# Patient Record
Sex: Female | Born: 1980 | Hispanic: Yes | Marital: Married | Smoking: Never smoker
Health system: Southern US, Community
[De-identification: ages and names within clinical notes are randomized; demographics above are authoritative.]

## PROBLEM LIST (undated history)

## (undated) DIAGNOSIS — Z789 Other specified health status: Secondary | ICD-10-CM

## (undated) DIAGNOSIS — K219 Gastro-esophageal reflux disease without esophagitis: Secondary | ICD-10-CM

## (undated) HISTORY — DX: Gastro-esophageal reflux disease without esophagitis: K21.9

## (undated) HISTORY — PX: NO PAST SURGERIES: SHX2092

---

## 2008-08-17 ENCOUNTER — Ambulatory Visit: Payer: Self-pay | Admitting: Certified Nurse Midwife

## 2009-01-20 ENCOUNTER — Observation Stay: Payer: Self-pay

## 2009-01-21 ENCOUNTER — Inpatient Hospital Stay: Payer: Self-pay

## 2010-09-03 ENCOUNTER — Ambulatory Visit: Payer: Self-pay | Admitting: Family

## 2015-08-19 NOTE — L&D Delivery Note (Signed)
Delivery Note At 7:52 PM a viable and female  was delivered via Vaginal, Spontaneous Delivery (Presentation:LOA ;  ).Delayed cord clamping   APGAR: 8, 9; weight  .   Placenta status:intact  , . 3v Cord:  with NO  complications: .    Anesthesia:   Episiotomy: None Lacerations: None Suture Repair: n/a Est. Blood Loss (mL):  200 cc  Mom to postpartum.  Baby to Couplet care / Skin to Skin.  Yvonne Sweeney 04/02/2016, 8:08 PM

## 2015-09-26 LAB — OB RESULTS CONSOLE HEPATITIS B SURFACE ANTIGEN: Hepatitis B Surface Ag: NEGATIVE

## 2015-09-26 LAB — OB RESULTS CONSOLE RPR: RPR: NONREACTIVE

## 2015-09-26 LAB — OB RESULTS CONSOLE HIV ANTIBODY (ROUTINE TESTING): HIV: NONREACTIVE

## 2015-09-26 LAB — OB RESULTS CONSOLE RUBELLA ANTIBODY, IGM: RUBELLA: IMMUNE

## 2015-09-26 LAB — OB RESULTS CONSOLE GC/CHLAMYDIA: GC PROBE AMP, GENITAL: NEGATIVE

## 2015-10-30 ENCOUNTER — Other Ambulatory Visit: Payer: Self-pay | Admitting: Primary Care

## 2015-10-30 DIAGNOSIS — Z3492 Encounter for supervision of normal pregnancy, unspecified, second trimester: Secondary | ICD-10-CM

## 2015-11-19 ENCOUNTER — Ambulatory Visit
Admission: RE | Admit: 2015-11-19 | Discharge: 2015-11-19 | Disposition: A | Discharge status: Home / Self Care | Payer: Medicaid Other | Source: Ambulatory Visit | Attending: Obstetrics & Gynecology | Admitting: Obstetrics & Gynecology

## 2015-11-19 DIAGNOSIS — Z3A19 19 weeks gestation of pregnancy: Secondary | ICD-10-CM | POA: Diagnosis not present

## 2015-11-19 DIAGNOSIS — Z3402 Encounter for supervision of normal first pregnancy, second trimester: Secondary | ICD-10-CM | POA: Insufficient documentation

## 2015-11-19 DIAGNOSIS — Z36 Encounter for antenatal screening of mother: Secondary | ICD-10-CM | POA: Diagnosis present

## 2015-11-19 DIAGNOSIS — Z3492 Encounter for supervision of normal pregnancy, unspecified, second trimester: Secondary | ICD-10-CM

## 2015-11-19 HISTORY — DX: Other specified health status: Z78.9

## 2015-12-03 ENCOUNTER — Ambulatory Visit
Admission: RE | Admit: 2015-12-03 | Discharge: 2015-12-03 | Disposition: A | Discharge status: Home / Self Care | Payer: Medicaid Other | Source: Ambulatory Visit | Attending: Maternal & Fetal Medicine | Admitting: Maternal & Fetal Medicine

## 2015-12-03 DIAGNOSIS — O26892 Other specified pregnancy related conditions, second trimester: Secondary | ICD-10-CM | POA: Insufficient documentation

## 2015-12-03 DIAGNOSIS — Z331 Pregnant state, incidental: Secondary | ICD-10-CM | POA: Diagnosis not present

## 2015-12-03 DIAGNOSIS — Z3A21 21 weeks gestation of pregnancy: Secondary | ICD-10-CM | POA: Insufficient documentation

## 2015-12-03 DIAGNOSIS — Z3492 Encounter for supervision of normal pregnancy, unspecified, second trimester: Secondary | ICD-10-CM

## 2016-02-22 LAB — OB RESULTS CONSOLE GC/CHLAMYDIA: CHLAMYDIA, DNA PROBE: NEGATIVE

## 2016-03-23 LAB — OB RESULTS CONSOLE GBS: STREP GROUP B AG: NEGATIVE

## 2016-04-02 ENCOUNTER — Encounter: Payer: Self-pay | Admitting: *Deleted

## 2016-04-02 ENCOUNTER — Inpatient Hospital Stay
Admission: RE | Admit: 2016-04-02 | Discharge: 2016-04-04 | DRG: 775 | Disposition: A | Discharge status: Home / Self Care | Payer: Medicaid Other | Attending: Obstetrics and Gynecology | Admitting: Obstetrics and Gynecology

## 2016-04-02 DIAGNOSIS — O479 False labor, unspecified: Secondary | ICD-10-CM

## 2016-04-02 DIAGNOSIS — Z3A38 38 weeks gestation of pregnancy: Secondary | ICD-10-CM

## 2016-04-02 DIAGNOSIS — Z3490 Encounter for supervision of normal pregnancy, unspecified, unspecified trimester: Secondary | ICD-10-CM

## 2016-04-02 LAB — RAPID HIV SCREEN (HIV 1/2 AB+AG)
HIV 1/2 Antibodies: NONREACTIVE
HIV-1 P24 ANTIGEN - HIV24: NONREACTIVE

## 2016-04-02 LAB — CBC
HCT: 36.9 % (ref 35.0–47.0)
Hemoglobin: 12.9 g/dL (ref 12.0–16.0)
MCH: 32 pg (ref 26.0–34.0)
MCHC: 35 g/dL (ref 32.0–36.0)
MCV: 91.3 fL (ref 80.0–100.0)
PLATELETS: 221 10*3/uL (ref 150–440)
RBC: 4.04 MIL/uL (ref 3.80–5.20)
RDW: 13.3 % (ref 11.5–14.5)
WBC: 10.1 10*3/uL (ref 3.6–11.0)

## 2016-04-02 LAB — TYPE AND SCREEN
ABO/RH(D): A POS
Antibody Screen: NEGATIVE

## 2016-04-02 MED ORDER — FERROUS SULFATE 325 (65 FE) MG PO TABS
325.0000 mg | ORAL_TABLET | Freq: Two times a day (BID) | ORAL | Status: DC
  Administered 2016-04-03 – 2016-04-04 (×3): 325 mg via ORAL
  Filled 2016-04-02 (×3): qty 1

## 2016-04-02 MED ORDER — SIMETHICONE 80 MG PO CHEW: 80.0000 mg | CHEWABLE_TABLET | ORAL | Status: DC | PRN

## 2016-04-02 MED ORDER — ACETAMINOPHEN 325 MG PO TABS: 650.0000 mg | ORAL_TABLET | ORAL | Status: DC | PRN

## 2016-04-02 MED ORDER — OXYTOCIN 40 UNITS IN LACTATED RINGERS INFUSION - SIMPLE MED
2.5000 [IU]/h | INTRAVENOUS | Status: DC
  Filled 2016-04-02: qty 1000

## 2016-04-02 MED ORDER — COCONUT OIL OIL
1.0000 "application " | TOPICAL_OIL | Status: DC | PRN
  Administered 2016-04-03: 1 via TOPICAL
  Filled 2016-04-02: qty 120

## 2016-04-02 MED ORDER — TERBUTALINE SULFATE 1 MG/ML IJ SOLN: 0.2500 mg | Freq: Once | INTRAMUSCULAR | Status: DC | PRN

## 2016-04-02 MED ORDER — OXYCODONE-ACETAMINOPHEN 5-325 MG PO TABS: 2.0000 | ORAL_TABLET | ORAL | Status: DC | PRN

## 2016-04-02 MED ORDER — MEASLES, MUMPS & RUBELLA VAC ~~LOC~~ INJ: 0.5000 mL | INJECTION | Freq: Once | SUBCUTANEOUS | Status: DC

## 2016-04-02 MED ORDER — ONDANSETRON HCL 4 MG/2ML IJ SOLN: 4.0000 mg | INTRAMUSCULAR | Status: DC | PRN

## 2016-04-02 MED ORDER — ZOLPIDEM TARTRATE 5 MG PO TABS: 5.0000 mg | ORAL_TABLET | Freq: Every evening | ORAL | Status: DC | PRN

## 2016-04-02 MED ORDER — MISOPROSTOL 200 MCG PO TABS
ORAL_TABLET | ORAL | Status: AC
  Filled 2016-04-02: qty 4

## 2016-04-02 MED ORDER — AMMONIA AROMATIC IN INHA
RESPIRATORY_TRACT | Status: AC
  Filled 2016-04-02: qty 10

## 2016-04-02 MED ORDER — LIDOCAINE HCL (PF) 1 % IJ SOLN: 30.0000 mL | INTRAMUSCULAR | Status: DC | PRN

## 2016-04-02 MED ORDER — OXYCODONE-ACETAMINOPHEN 5-325 MG PO TABS: 1.0000 | ORAL_TABLET | ORAL | Status: DC | PRN

## 2016-04-02 MED ORDER — SENNOSIDES-DOCUSATE SODIUM 8.6-50 MG PO TABS
2.0000 | ORAL_TABLET | ORAL | Status: DC
  Administered 2016-04-03 – 2016-04-04 (×2): 2 via ORAL
  Filled 2016-04-02 (×2): qty 2

## 2016-04-02 MED ORDER — DIBUCAINE 1 % RE OINT: 1.0000 "application " | TOPICAL_OINTMENT | RECTAL | Status: DC | PRN

## 2016-04-02 MED ORDER — OXYTOCIN 10 UNIT/ML IJ SOLN
INTRAMUSCULAR | Status: AC
  Filled 2016-04-02: qty 2

## 2016-04-02 MED ORDER — BUTORPHANOL TARTRATE 1 MG/ML IJ SOLN
INTRAMUSCULAR | Status: AC
  Administered 2016-04-02: 1 mg via INTRAVENOUS
  Filled 2016-04-02: qty 1

## 2016-04-02 MED ORDER — OXYTOCIN BOLUS FROM INFUSION
500.0000 mL | Freq: Once | INTRAVENOUS | Status: AC
  Administered 2016-04-02: 500 mL via INTRAVENOUS

## 2016-04-02 MED ORDER — IBUPROFEN 600 MG PO TABS
600.0000 mg | ORAL_TABLET | Freq: Four times a day (QID) | ORAL | Status: DC
  Administered 2016-04-03 – 2016-04-04 (×5): 600 mg via ORAL
  Filled 2016-04-02 (×5): qty 1

## 2016-04-02 MED ORDER — OXYTOCIN 40 UNITS IN LACTATED RINGERS INFUSION - SIMPLE MED
2.0000 m[IU]/min | INTRAVENOUS | Status: DC
  Administered 2016-04-02: 2 m[IU]/min via INTRAVENOUS

## 2016-04-02 MED ORDER — SOD CITRATE-CITRIC ACID 500-334 MG/5ML PO SOLN: 30.0000 mL | ORAL | Status: DC | PRN

## 2016-04-02 MED ORDER — LACTATED RINGERS IV SOLN
500.0000 mL | INTRAVENOUS | Status: DC | PRN
Start: 2016-04-02 — End: 2016-04-03

## 2016-04-02 MED ORDER — ONDANSETRON HCL 4 MG/2ML IJ SOLN: 4.0000 mg | Freq: Four times a day (QID) | INTRAMUSCULAR | Status: DC | PRN

## 2016-04-02 MED ORDER — MAGNESIUM HYDROXIDE 400 MG/5ML PO SUSP: 30.0000 mL | ORAL | Status: DC | PRN

## 2016-04-02 MED ORDER — BUTORPHANOL TARTRATE 1 MG/ML IJ SOLN
1.0000 mg | INTRAMUSCULAR | Status: DC | PRN
  Administered 2016-04-02: 1 mg via INTRAVENOUS

## 2016-04-02 MED ORDER — ONDANSETRON HCL 4 MG PO TABS: 4.0000 mg | ORAL_TABLET | ORAL | Status: DC | PRN

## 2016-04-02 MED ORDER — LACTATED RINGERS IV SOLN
INTRAVENOUS | Status: DC
  Administered 2016-04-02 (×2): 125 mL/h via INTRAVENOUS

## 2016-04-02 MED ORDER — DIPHENHYDRAMINE HCL 25 MG PO CAPS: 25.0000 mg | ORAL_CAPSULE | Freq: Four times a day (QID) | ORAL | Status: DC | PRN

## 2016-04-02 MED ORDER — WITCH HAZEL-GLYCERIN EX PADS: 1.0000 "application " | MEDICATED_PAD | CUTANEOUS | Status: DC | PRN

## 2016-04-02 MED ORDER — LIDOCAINE HCL (PF) 1 % IJ SOLN
INTRAMUSCULAR | Status: AC
  Filled 2016-04-02: qty 30

## 2016-04-02 MED ORDER — BENZOCAINE-MENTHOL 20-0.5 % EX AERO: 1.0000 "application " | INHALATION_SPRAY | CUTANEOUS | Status: DC | PRN

## 2016-04-02 MED ORDER — PRENATAL MULTIVITAMIN CH
1.0000 | ORAL_TABLET | Freq: Every day | ORAL | Status: DC
  Administered 2016-04-03: 1 via ORAL
  Filled 2016-04-02: qty 1

## 2016-04-02 NOTE — H&P (Signed)
Yvonne Sweeney is a 35 y.o. female presenting for 38+4 weeks .by lmp and conf u/s . Admitted for ROM nitrazine + . No significant prenatal issues . Neg GBS OB History    Gravida Para Term Preterm AB Living   4 2 2     3    SAB TAB Ectopic Multiple Live Births           3     Past Medical History:  Diagnosis Date  . Medical history non-contributory    Past Surgical History:  Procedure Laterality Date  . NO PAST SURGERIES     Family History: family history is not on file. Social History:  reports that she has never smoked. She has never used smokeless tobacco. She reports that she does not drink alcohol or use drugs.     Maternal Diabetes: No Genetic Screening: Declined Maternal Ultrasounds/Referrals: Normal Fetal Ultrasounds or other Referrals:  None Maternal Substance Abuse:  No Significant Maternal Medications:  None Significant Maternal Lab Results:  None Other Comments:  None  ROS History Dilation: 2 Exam by:: A. Millner Last menstrual period 07/08/2015. Exam Physical Exam   Lungs CTA  CV RRR  adb gravid  EFM: 140 + ACCELS , NO DECELS - REASSURING , infrequent CTX  Prenatal labs: ABO, Rh:  A+ Antibody:  neg Rubella:  imm RPR:   NR HBsAg:  neg  HIV:   neg GBS:   Neg GBS   Assessment/Plan: 38+4 week  SROM  Will place FSE and IUPC and start Pitocin   SCHERMERHORN,THOMAS 04/02/2016, 10:20 AM

## 2016-04-02 NOTE — Progress Notes (Signed)
Pt. Arrived with spouse...with c/o of SROM at 0600 this a.m.  Clear fluid, mod. Amount.  Nitrazine positive, pt. Admit to LDR #2. Provider notified. Pt. Not feeling contractions, abd soft.  Toco and U/S applied, FHR - 140s.

## 2016-04-02 NOTE — Progress Notes (Signed)
Patient ID: Yvonne Sweeney, female   DOB: October 17, 1980, 35 y.o.   MRN: [id number] Pit at 12 mu/ min  Ctx q 2-3 min MVU 240  vss   cx by RN : 4 cm /OOP reassurring fetal monitoring  Cont Pitocin :

## 2016-04-02 NOTE — Progress Notes (Signed)
Afanador, Research officer, trade unionpanish Interpreter,  at the bedside for admission consent and plan of care...discussed with patient. Pt. Desires to leave options open in case epidural is needed; pt. Desires to start with pain medication in IV. Delivered last three babies with pain medication via IV.

## 2016-04-03 ENCOUNTER — Encounter: Payer: Self-pay | Admitting: *Deleted

## 2016-04-03 DIAGNOSIS — Z3490 Encounter for supervision of normal pregnancy, unspecified, unspecified trimester: Secondary | ICD-10-CM

## 2016-04-03 LAB — CBC
HCT: 35.9 % (ref 35.0–47.0)
HEMOGLOBIN: 12.4 g/dL (ref 12.0–16.0)
MCH: 31.7 pg (ref 26.0–34.0)
MCHC: 34.5 g/dL (ref 32.0–36.0)
MCV: 92.1 fL (ref 80.0–100.0)
PLATELETS: 195 10*3/uL (ref 150–440)
RBC: 3.9 MIL/uL (ref 3.80–5.20)
RDW: 13.1 % (ref 11.5–14.5)
WBC: 16.8 10*3/uL — AB (ref 3.6–11.0)

## 2016-04-03 LAB — RPR: RPR Ser Ql: NONREACTIVE

## 2016-04-03 NOTE — Progress Notes (Signed)
RN edited the Deliver Summary to change the infant's birth weight; the correct infant birth weight was 3280g (7lbs 4oz); the baby's birth weight was entered at 3932801g (72lbs); all that was edited on the Delivery Summary was the infant birth weight

## 2016-04-03 NOTE — Progress Notes (Signed)
Post Partum Day 1 Subjective: no complaints, up ad lib, voiding and tolerating PO  Objective: Blood pressure (!) 98/54, pulse 70, temperature 98.5 F (36.9 C), temperature source Oral, resp. rate 18, height 5\' 5"  (1.651 m), weight 193 lb (87.5 kg), last menstrual period 07/08/2015, SpO2 99 %, unknown if currently breastfeeding.  Physical Exam:  General: alert, cooperative and appears stated age Lochia: appropriate Uterine Fundus: firm DVT Evaluation: No evidence of DVT seen on physical exam.   Recent Labs  04/02/16 1032 04/03/16 0415  HGB 12.9 12.4  HCT 36.9 35.9    Assessment/Plan: Plan for discharge tomorrow and Breastfeeding   LOS: 1 day   Yvonne Sweeney 04/03/2016, 9:02 AM

## 2016-04-04 MED ORDER — IBUPROFEN 600 MG PO TABS: 600.0000 mg | ORAL_TABLET | Freq: Four times a day (QID) | ORAL | 0 refills | Status: DC

## 2016-04-04 NOTE — Discharge Summary (Signed)
Obstetric Discharge Summary Reason for Admission: onset of labor Prenatal Procedures: none Intrapartum Procedures: spontaneous vaginal delivery Postpartum Procedures: none Complications-Operative and Postpartum: none Hemoglobin  Date Value Ref Range Status  04/03/2016 12.4 12.0 - 16.0 g/dL Final   HCT  Date Value Ref Range Status  04/03/2016 35.9 35.0 - 47.0 % Final  Brief Hospital Course  Roe Rutherforddriana Coulibaly is a G4P4who had a SVD on 04/02/16;  for further details of this delivery, please refer to the delivery note.  Patient had an uncomplicated postpartum course.  By time of discharge on PPD#2, her pain was controlled on oral pain medications; she had appropriate lochia and was ambulating, voiding without difficulty and tolerating regular diet.  She was deemed stable for discharge to home.    Physical Exam:  General: alert and cooperative Lochia: appropriate Uterine Fundus: firm Incision: none DVT Evaluation: No evidence of DVT seen on physical exam.  Discharge Diagnoses: Term Pregnancy-delivered  Discharge Information: Date: 04/04/2016 Activity: pelvic rest Diet: routine Medications: Ibuprofen Condition: stable Instructions:  Discharge Instructions    Activity as tolerated    Complete by:  As directed   Call MD for:    Complete by:  As directed   Signs and symptoms of postpartum depression, unable to care for yourself or your baby.  If you develop suicidal or homicidal thoughts, go to the emergency room immediately.   Call MD for:  difficulty breathing, headache or visual disturbances    Complete by:  As directed   Call MD for:  extreme fatigue    Complete by:  As directed   Call MD for:  hives    Complete by:  As directed   Call MD for:  persistant dizziness or light-headedness    Complete by:  As directed   Call MD for:  persistant nausea and vomiting    Complete by:  As directed   Call MD for:  redness, tenderness, or signs of infection (pain, swelling, redness, odor or  green/yellow discharge around incision site)    Complete by:  As directed   Call MD for:  severe uncontrolled pain    Complete by:  As directed   Call MD for:  temperature >100.4    Complete by:  As directed   Diet - low sodium heart healthy    Complete by:  As directed   Sexual acrtivity    Complete by:  As directed   No intercourse for 6 weeks     Discharge to: home Follow-up Information    [facility]   Specialty:  General Practice Why:  Postpartum visit Contact information: [address] [place] [address] [phone]         Contraception: condoms   Newborn Data: Live born female  Birth Weight: 7 lb 3.7 oz (3280 g) APGAR: 8, 9 Breast and Formula feeding Home with mother.  Karena AddisonSigmon, Vergil Burby C 04/04/2016, 8:31 AM

## 2016-04-04 NOTE — Progress Notes (Signed)
Pt discharged home with infant.  Discharge instructions and follow up appointment given to and reviewed with pt.  Pt verbalized understanding.  Escorted by auxillary. 

## 2016-04-04 NOTE — Discharge Instructions (Signed)

## 2017-08-18 NOTE — L&D Delivery Note (Signed)
Delivery Note At 2:28 AM a viable and healthy girl "Yvonne Sweeney" was delivered via Vaginal, Spontaneous (Presentation: ROA).  APGAR: 8,9. weight 7#10oz .   Placenta status: spontaneous, intact  Cord: 3vessel cord with the following complications: none.  Cord pH: 7.19, Co2 67.  Anesthesia:  epidural Episiotomy:  none Lacerations:  none Suture Repair: n/a Est. Blood Loss (mL):    Mom to postpartum.  Baby to Couplet care / Skin to Skin.  I was present and scrubbed for the entirety of the delivery  37yo G5P4004 at 40+0wks presenting with SROM at 0300 yesterday, progressed with pitocin to fully dilated. However, fetal heart rate tracing Cat II with tachycardia throughout the day, and at the end, deep repetitive variable decels. The CNM Haviland called me to bedside, where I found an asynclitic LOP baby at -1 station, significant labial edema and a comfortable epidural. We discussed possible need for cesarean, and after consenting patient, I destationed the baby and rotated her to ROA, and the patient pushed well over an intact perineum. She delivered easily in several pushes and baby was promptly vigorous and placed on maternal abdomen. The placenta delivered spontaneously and was intact. Her fundus was firm and postpartum pit running. Cord gases were collected.  Yvonne Sweeney 03/13/2018, 2:34 AM

## 2017-10-01 ENCOUNTER — Other Ambulatory Visit: Payer: Self-pay | Admitting: Primary Care

## 2017-10-01 DIAGNOSIS — Z3482 Encounter for supervision of other normal pregnancy, second trimester: Secondary | ICD-10-CM

## 2017-10-01 LAB — OB RESULTS CONSOLE GC/CHLAMYDIA
Chlamydia: NEGATIVE
Gonorrhea: NEGATIVE

## 2017-10-01 LAB — OB RESULTS CONSOLE VARICELLA ZOSTER ANTIBODY, IGG: VARICELLA IGG: IMMUNE

## 2017-10-01 LAB — OB RESULTS CONSOLE RPR: RPR: NONREACTIVE

## 2017-10-01 LAB — OB RESULTS CONSOLE HEPATITIS B SURFACE ANTIGEN: HEP B S AG: NEGATIVE

## 2017-10-01 LAB — OB RESULTS CONSOLE RUBELLA ANTIBODY, IGM: RUBELLA: IMMUNE

## 2017-10-01 LAB — OB RESULTS CONSOLE GBS: GBS: NEGATIVE

## 2017-10-22 ENCOUNTER — Ambulatory Visit
Admission: RE | Admit: 2017-10-22 | Discharge: 2017-10-22 | Disposition: A | Discharge status: Home / Self Care | Payer: Self-pay | Source: Ambulatory Visit | Attending: Primary Care | Admitting: Primary Care

## 2017-10-22 DIAGNOSIS — Z3A19 19 weeks gestation of pregnancy: Secondary | ICD-10-CM | POA: Insufficient documentation

## 2017-10-22 DIAGNOSIS — Z3482 Encounter for supervision of other normal pregnancy, second trimester: Secondary | ICD-10-CM | POA: Insufficient documentation

## 2018-03-09 ENCOUNTER — Other Ambulatory Visit: Payer: Self-pay | Admitting: Obstetrics and Gynecology

## 2018-03-09 NOTE — Progress Notes (Signed)
Prenatal Labs: Blood type/Rh  A Pos  Antibody screen neg  Rubella Immune  Varicella Immune  RPR NR  HBsAg Neg  HIV NR  GC neg  Chlamydia neg  Genetic screening  declined  1 hour GTT  90  GBS  neg 02/21/18   [facility] by L[facility], C/w US at 19+2wks AMA, age 37, Tdap given 01/08/18 Cholestasis dx 7/22

## 2018-03-12 ENCOUNTER — Inpatient Hospital Stay: Payer: Medicaid Other | Admitting: Anesthesiology

## 2018-03-12 ENCOUNTER — Other Ambulatory Visit: Payer: Self-pay

## 2018-03-12 ENCOUNTER — Inpatient Hospital Stay
Admission: RE | Admit: 2018-03-12 | Discharge: 2018-03-14 | DRG: 806 | Disposition: A | Discharge status: Home / Self Care | Payer: Medicaid Other | Attending: Certified Nurse Midwife | Admitting: Certified Nurse Midwife

## 2018-03-12 DIAGNOSIS — Z3A39 39 weeks gestation of pregnancy: Secondary | ICD-10-CM

## 2018-03-12 DIAGNOSIS — O4292 Full-term premature rupture of membranes, unspecified as to length of time between rupture and onset of labor: Principal | ICD-10-CM | POA: Diagnosis present

## 2018-03-12 DIAGNOSIS — O4290 Premature rupture of membranes, unspecified as to length of time between rupture and onset of labor, unspecified weeks of gestation: Secondary | ICD-10-CM | POA: Diagnosis present

## 2018-03-12 LAB — COMPREHENSIVE METABOLIC PANEL
ALT: 36 U/L (ref 0–44)
AST: 38 U/L (ref 15–41)
Albumin: 2.7 g/dL — ABNORMAL LOW (ref 3.5–5.0)
Alkaline Phosphatase: 194 U/L — ABNORMAL HIGH (ref 38–126)
Anion gap: 6 (ref 5–15)
BUN: 14 mg/dL (ref 6–20)
CO2: 23 mmol/L (ref 22–32)
Calcium: 8.7 mg/dL — ABNORMAL LOW (ref 8.9–10.3)
Chloride: 108 mmol/L (ref 98–111)
Creatinine, Ser: 0.56 mg/dL (ref 0.44–1.00)
Glucose, Bld: 88 mg/dL (ref 70–99)
POTASSIUM: 4 mmol/L (ref 3.5–5.1)
Sodium: 137 mmol/L (ref 135–145)
Total Bilirubin: 0.9 mg/dL (ref 0.3–1.2)
Total Protein: 6.6 g/dL (ref 6.5–8.1)

## 2018-03-12 LAB — CBC
HCT: 37.8 % (ref 35.0–47.0)
Hemoglobin: 13.1 g/dL (ref 12.0–16.0)
MCH: 31.7 pg (ref 26.0–34.0)
MCHC: 34.6 g/dL (ref 32.0–36.0)
MCV: 91.6 fL (ref 80.0–100.0)
PLATELETS: 207 10*3/uL (ref 150–440)
RBC: 4.13 MIL/uL (ref 3.80–5.20)
RDW: 13.3 % (ref 11.5–14.5)
WBC: 9.2 10*3/uL (ref 3.6–11.0)

## 2018-03-12 LAB — TYPE AND SCREEN
ABO/RH(D): A POS
Antibody Screen: NEGATIVE

## 2018-03-12 LAB — RAPID HIV SCREEN (HIV 1/2 AB+AG)
HIV 1/2 Antibodies: NONREACTIVE
HIV-1 P24 ANTIGEN - HIV24: NONREACTIVE

## 2018-03-12 MED ORDER — OXYTOCIN 10 UNIT/ML IJ SOLN
INTRAMUSCULAR | Status: AC
  Filled 2018-03-12: qty 2

## 2018-03-12 MED ORDER — LIDOCAINE HCL (PF) 1 % IJ SOLN
INTRAMUSCULAR | Status: AC
  Filled 2018-03-12: qty 30

## 2018-03-12 MED ORDER — OXYTOCIN 40 UNITS IN LACTATED RINGERS INFUSION - SIMPLE MED
1.0000 m[IU]/min | INTRAVENOUS | Status: DC
  Administered 2018-03-12: 1 m[IU]/min via INTRAVENOUS
  Filled 2018-03-12: qty 1000

## 2018-03-12 MED ORDER — ACETAMINOPHEN 325 MG PO TABS: 650.0000 mg | ORAL_TABLET | ORAL | Status: DC | PRN

## 2018-03-12 MED ORDER — BUTORPHANOL TARTRATE 2 MG/ML IJ SOLN
1.0000 mg | INTRAMUSCULAR | Status: DC | PRN
  Administered 2018-03-12: 1 mg via INTRAVENOUS
  Filled 2018-03-12: qty 1

## 2018-03-12 MED ORDER — MISOPROSTOL 200 MCG PO TABS
ORAL_TABLET | ORAL | Status: AC
  Filled 2018-03-12: qty 4

## 2018-03-12 MED ORDER — TERBUTALINE SULFATE 1 MG/ML IJ SOLN: 0.2500 mg | Freq: Once | INTRAMUSCULAR | Status: DC | PRN

## 2018-03-12 MED ORDER — METHYLERGONOVINE MALEATE 0.2 MG/ML IJ SOLN
INTRAMUSCULAR | Status: AC
  Filled 2018-03-12: qty 1

## 2018-03-12 MED ORDER — LACTATED RINGERS IV SOLN: 500.0000 mL | INTRAVENOUS | Status: DC | PRN

## 2018-03-12 MED ORDER — MISOPROSTOL 25 MCG QUARTER TABLET: 25.0000 ug | ORAL_TABLET | ORAL | Status: DC

## 2018-03-12 MED ORDER — ONDANSETRON HCL 4 MG/2ML IJ SOLN: 4.0000 mg | Freq: Four times a day (QID) | INTRAMUSCULAR | Status: DC | PRN

## 2018-03-12 MED ORDER — FENTANYL CITRATE (PF) 100 MCG/2ML IJ SOLN
50.0000 ug | INTRAMUSCULAR | Status: DC | PRN
Start: 2018-03-12 — End: 2018-03-12

## 2018-03-12 MED ORDER — SOD CITRATE-CITRIC ACID 500-334 MG/5ML PO SOLN: 30.0000 mL | ORAL | Status: DC | PRN

## 2018-03-12 MED ORDER — LIDOCAINE HCL (PF) 1 % IJ SOLN: 30.0000 mL | INTRAMUSCULAR | Status: DC | PRN

## 2018-03-12 MED ORDER — MISOPROSTOL 25 MCG QUARTER TABLET
25.0000 ug | ORAL_TABLET | ORAL | Status: DC | PRN
  Administered 2018-03-12: 25 ug via BUCCAL
  Filled 2018-03-12: qty 1

## 2018-03-12 MED ORDER — OXYTOCIN 40 UNITS IN LACTATED RINGERS INFUSION - SIMPLE MED: 2.5000 [IU]/h | INTRAVENOUS | Status: DC

## 2018-03-12 MED ORDER — MISOPROSTOL 25 MCG QUARTER TABLET: 25.0000 ug | ORAL_TABLET | ORAL | Status: DC | PRN

## 2018-03-12 MED ORDER — LACTATED RINGERS IV SOLN
INTRAVENOUS | Status: DC
  Administered 2018-03-12: 14:00:00 via INTRAVENOUS

## 2018-03-12 MED ORDER — OXYTOCIN BOLUS FROM INFUSION: 500.0000 mL | Freq: Once | INTRAVENOUS | Status: DC

## 2018-03-12 MED ORDER — OXYTOCIN BOLUS FROM INFUSION
500.0000 mL | Freq: Once | INTRAVENOUS | Status: AC
  Administered 2018-03-13: 500 mL via INTRAVENOUS

## 2018-03-12 MED ORDER — LACTATED RINGERS IV SOLN: INTRAVENOUS | Status: DC

## 2018-03-12 MED ORDER — AMMONIA AROMATIC IN INHA
RESPIRATORY_TRACT | Status: AC
  Filled 2018-03-12: qty 10

## 2018-03-12 NOTE — Progress Notes (Signed)
Labor Progress Note  Yvonne Sweeney is a 37 y.o. G5P4004 at 2860w6d by LMP c/w 2759w2d ultrasound admitted for PROM.  Subjective:  Patient resting in bed on her left side with peanut ball in place, very comfortable.   Objective: BP 115/72   Pulse 62   Temp 98.4 F (36.9 C) (Oral)   Resp 18   Ht 5\' 4"  (1.626 m)   Wt 93 kg (205 lb)   LMP 06/06/2017   BMI 35.19 kg/m   Fetal Assessment: FHT:  FHR: 140 bpm, variability: moderate,  accelerations:  Present,  decelerations:  Present variable Category/reactivity:  Category II UC:   irregular, every 3-6 minutes SVE:    Dilation: 3cm  Effacement: 60%  Station:  -3  Consistency: medium  Position: middle  Membrane status: PROM 03/12/18 0700 Amniotic color: clear  Labs: Lab Results  Component Value Date   WBC 9.2 03/12/2018   HGB 13.1 03/12/2018   HCT 37.8 03/12/2018   MCV 91.6 03/12/2018   PLT 207 03/12/2018    Assessment / Plan: Induction of labor due to PROM  Labor: s/p 1 dose of misoprostol, plan to start pitocin now and titrate per protocol Fetal Wellbeing:  Category II for non-recurrent variable decels, overall reassuring Pain Control:  Labor support without medications Anticipated MOD:  NSVD  Genia DelMargaret Alverto Shedd, CNM 03/12/2018, 5:34 PM

## 2018-03-12 NOTE — H&P (Addendum)
OB History & Physical   History of Present Illness:  Chief Complaint:   HPI:  Yvonne Sweeney is a 37 y.o. G4P4004 female at [redacted]w[redacted]d dated by [facility] 03/13/18 by LMP, c/w [redacted]w[redacted]d ultrasound. She presents to L&D with reports of rupture of membranes at 0700 this morning for clear fluid. [facility] was [facility] for an appointment this morning, where she continued to leak clear fluid and they sent her here for further evaluation.   She reports:  -active fetal movement -LOF at 0700 this morning  -no vaginal bleeding -no contractions, but some back pain  Pregnancy Issues: 1. AMA, age 56 2. Previous concern for cholestasis, but labs WNL 3. PROM   Maternal Medical History:   Past Medical History:  Diagnosis Date  . Medical history non-contributory     Past Surgical History:  Procedure Laterality Date  . NO PAST SURGERIES      No Known Allergies  Prior to Admission medications   Medication Sig Start Date End Date Taking? Authorizing Provider  Prenatal Vit-Fe Fumarate-FA (PRENATAL MULTIVITAMIN) TABS tablet Take 1 tablet by mouth daily at 12 noon.   Yes [provider]  ibuprofen (ADVIL,MOTRIN) 600 MG tablet Take 1 tablet (600 mg total) by mouth every 6 (six) hours. Patient not taking: Reported on 03/12/2018 04/04/16   Karena Addison, CNM     [facility] site[facility]  Social History: She  reports that she has never smoked. She has never used smokeless tobacco. She reports that she does not drink alcohol or use drugs.  Family History: family history includes Diabetes in her maternal aunt and maternal grandmother; Heart disease in her paternal grandmother.   Review of Systems: A full review of systems was performed and negative except as noted in the HPI.    Physical Exam:  Vital Signs: BP 118/66 (BP Location: Right Arm)   Pulse 84   Temp 98.3 F (36.8 C) (Oral)   Resp 18   LMP 06/06/2017   General:   alert, cooperative, appears stated age and mild  distress  Skin:  normal and no rash or abnormalities  Neurologic:    Alert & oriented x 3  Lungs:   clear to auscultation bilaterally  Heart:   regular rate and rhythm, S1, S2 normal, no murmur, click, rub or gallop  Abdomen:  soft, non-tender; bowel sounds normal; no masses,  no organomegaly  Pelvis:  Vulva and vagina appear normal. Positive pool and positive fern. Nitrazine paper unavailable.   FHT:  145 BPM  Presentations: cephalic  Cervix:    Dilation: 2.5cm   Effacement: 60%   Station:  -3   Consistency: medium   Position: posterior  Extremities: : non-tender, symmetric, no edema bilaterally.    EFW: 6lb12oz   Pertinent Results:  Prenatal Labs: Blood type/Rh A+  Antibody screen neg  Rubella Immune  Varicella Immune  RPR NR  HBsAg Neg  HIV NR  GC neg  Chlamydia neg  Genetic screening declined  1 hour GTT 90  3 hour GTT n/a  GBS negative   FHT: FHR: 145 bpm, variability: moderate,  accelerations:  Present,  decelerations:  Present variable Category/reactivity:  Category II TOCO: irregular, every 2-4 minutes   Assessment:  Yvonne Sweeney is a 37 y.o. G37P4004 female at [redacted]w[redacted]d with PROM.   Plan:  1. Admit to Labor & Delivery; consents reviewed and obtained  2. Fetal Well being  - Fetal Tracing: category II for variable decels, overall reassuring -  GBS negative. - Presentation: cephalic confirmed by Leopold's   3. Routine OB: - Prenatal labs reviewed, as above - Rh positive - CBC & T&S on admit - Clear fluids, IVF  4. Induction of Labor -  Contractions to be monitored with external toco in place -  Pelvis proven -  Plan for induction with misoprostol  -  Plan for continuous fetal monitoring  -  Maternal pain control as desired: IVPM, nitrous, regional anesthesia -  Anticipate vaginal delivery  5. Post Partum Planning: - Infant feeding: breastfeeding - Contraception: TBD  Yvonne Sweeney, CNM 03/12/2018 1:05 PM ----- Yvonne DelMargaret Sante Biedermann Certified  Nurse Midwife [facility], Department of OB/GYN [facility]

## 2018-03-12 NOTE — Anesthesia Preprocedure Evaluation (Addendum)
Anesthesia Evaluation  Patient identified by MRN, date of birth, ID band Patient awake    Reviewed: Allergy & Precautions, NPO status , Patient's Chart, lab work & pertinent test results  Airway Mallampati: II  TM Distance: >3 FB     Dental  (+) Teeth Intact   Pulmonary neg pulmonary ROS,    Pulmonary exam normal        Cardiovascular negative cardio ROS Normal cardiovascular exam     Neuro/Psych negative neurological ROS  negative psych ROS   GI/Hepatic negative GI ROS, Neg liver ROS,   Endo/Other  negative endocrine ROS  Renal/GU negative Renal ROS  negative genitourinary   Musculoskeletal negative musculoskeletal ROS (+)   Abdominal Normal abdominal exam  (+)   Peds negative pediatric ROS (+)  Hematology negative hematology ROS (+)   Anesthesia Other Findings   Reproductive/Obstetrics (+) Pregnancy                             Anesthesia Physical Anesthesia Plan  ASA: II  Anesthesia Plan: Epidural   Post-op Pain Management:    Induction:   PONV Risk Score and Plan:   Airway Management Planned: Natural Airway  Additional Equipment:   Intra-op Plan:   Post-operative Plan:   Informed Consent:   Dental advisory given  Plan Discussed with: CRNA and Surgeon  Anesthesia Plan Comments:        Anesthesia Quick Evaluation

## 2018-03-13 LAB — RPR: RPR: NONREACTIVE

## 2018-03-13 MED ORDER — FLEET ENEMA 7-19 GM/118ML RE ENEM: 1.0000 | ENEMA | Freq: Every day | RECTAL | Status: DC | PRN

## 2018-03-13 MED ORDER — FENTANYL 2.5 MCG/ML W/ROPIVACAINE 0.15% IN NS 100 ML EPIDURAL (ARMC): 12.0000 mL/h | EPIDURAL | Status: DC

## 2018-03-13 MED ORDER — WITCH HAZEL-GLYCERIN EX PADS
1.0000 "application " | MEDICATED_PAD | CUTANEOUS | Status: DC | PRN
  Filled 2018-03-13: qty 100

## 2018-03-13 MED ORDER — EPHEDRINE 5 MG/ML INJ: 10.0000 mg | INTRAVENOUS | Status: DC | PRN

## 2018-03-13 MED ORDER — SODIUM CHLORIDE 0.9 % IV SOLN: 250.0000 mL | INTRAVENOUS | Status: DC | PRN

## 2018-03-13 MED ORDER — FENTANYL 2.5 MCG/ML W/ROPIVACAINE 0.15% IN NS 100 ML EPIDURAL (ARMC)
EPIDURAL | Status: AC
  Filled 2018-03-13: qty 100

## 2018-03-13 MED ORDER — LIDOCAINE-EPINEPHRINE (PF) 1.5 %-1:200000 IJ SOLN
INTRAMUSCULAR | Status: DC | PRN
  Administered 2018-03-12: 3 mL via PERINEURAL

## 2018-03-13 MED ORDER — DIPHENHYDRAMINE HCL 25 MG PO CAPS: 25.0000 mg | ORAL_CAPSULE | Freq: Four times a day (QID) | ORAL | Status: DC | PRN

## 2018-03-13 MED ORDER — ACETAMINOPHEN 325 MG PO TABS: 650.0000 mg | ORAL_TABLET | ORAL | Status: DC | PRN

## 2018-03-13 MED ORDER — COCONUT OIL OIL: 1.0000 "application " | TOPICAL_OIL | Status: DC | PRN

## 2018-03-13 MED ORDER — PHENYLEPHRINE 40 MCG/ML (10ML) SYRINGE FOR IV PUSH (FOR BLOOD PRESSURE SUPPORT): 80.0000 ug | PREFILLED_SYRINGE | INTRAVENOUS | Status: DC | PRN

## 2018-03-13 MED ORDER — IBUPROFEN 600 MG PO TABS
600.0000 mg | ORAL_TABLET | Freq: Four times a day (QID) | ORAL | Status: DC
  Administered 2018-03-13 – 2018-03-14 (×6): 600 mg via ORAL
  Filled 2018-03-13 (×6): qty 1

## 2018-03-13 MED ORDER — SODIUM CHLORIDE 0.9% FLUSH: 3.0000 mL | INTRAVENOUS | Status: DC | PRN

## 2018-03-13 MED ORDER — DIBUCAINE 1 % RE OINT: 1.0000 "application " | TOPICAL_OINTMENT | RECTAL | Status: DC | PRN

## 2018-03-13 MED ORDER — LIDOCAINE HCL (PF) 1 % IJ SOLN
INTRAMUSCULAR | Status: DC | PRN
  Administered 2018-03-12: 3 mL

## 2018-03-13 MED ORDER — SENNOSIDES-DOCUSATE SODIUM 8.6-50 MG PO TABS
2.0000 | ORAL_TABLET | ORAL | Status: DC
  Administered 2018-03-13: 2 via ORAL
  Filled 2018-03-13 (×2): qty 2

## 2018-03-13 MED ORDER — LIDOCAINE HCL (PF) 2 % IJ SOLN
INTRAMUSCULAR | Status: DC | PRN
  Administered 2018-03-13: 6 mL via INTRADERMAL

## 2018-03-13 MED ORDER — ONDANSETRON HCL 4 MG PO TABS: 4.0000 mg | ORAL_TABLET | ORAL | Status: DC | PRN

## 2018-03-13 MED ORDER — LACTATED RINGERS IV SOLN: 500.0000 mL | Freq: Once | INTRAVENOUS | Status: DC

## 2018-03-13 MED ORDER — SIMETHICONE 80 MG PO CHEW: 80.0000 mg | CHEWABLE_TABLET | ORAL | Status: DC | PRN

## 2018-03-13 MED ORDER — SODIUM CHLORIDE 0.9% FLUSH: 3.0000 mL | Freq: Two times a day (BID) | INTRAVENOUS | Status: DC

## 2018-03-13 MED ORDER — PRENATAL MULTIVITAMIN CH
1.0000 | ORAL_TABLET | Freq: Every day | ORAL | Status: DC
  Administered 2018-03-13 – 2018-03-14 (×2): 1 via ORAL
  Filled 2018-03-13 (×2): qty 1

## 2018-03-13 MED ORDER — BENZOCAINE-MENTHOL 20-0.5 % EX AERO: 1.0000 "application " | INHALATION_SPRAY | CUTANEOUS | Status: DC | PRN

## 2018-03-13 MED ORDER — BUPIVACAINE HCL (PF) 0.25 % IJ SOLN
INTRAMUSCULAR | Status: DC | PRN
  Administered 2018-03-13: 10 mL via EPIDURAL

## 2018-03-13 MED ORDER — BISACODYL 10 MG RE SUPP: 10.0000 mg | Freq: Every day | RECTAL | Status: DC | PRN

## 2018-03-13 MED ORDER — DIPHENHYDRAMINE HCL 50 MG/ML IJ SOLN: 12.5000 mg | INTRAMUSCULAR | Status: DC | PRN

## 2018-03-13 MED ORDER — ONDANSETRON HCL 4 MG/2ML IJ SOLN: 4.0000 mg | INTRAMUSCULAR | Status: DC | PRN

## 2018-03-13 NOTE — Discharge Instructions (Signed)

## 2018-03-13 NOTE — Progress Notes (Signed)
Post Partum Day 0  Subjective: Doing well, no concerns. Ambulating without difficulty, pain managed with PO meds, tolerating regular diet, and voiding without difficulty.   No fever/chills, chest pain, shortness of breath, nausea/vomiting, or leg pain. No nipple or breast pain.   Objective: BP 109/64 (BP Location: Left Arm)   Pulse 78   Temp 98.5 F (36.9 C) (Oral)   Resp 20   Ht 5\' 4"  (1.626 m)   Wt 93 kg (205 lb)   LMP 06/06/2017   SpO2 98%   Breastfeeding? Unknown   BMI 35.19 kg/m    Physical Exam:  General: alert, cooperative, appears stated age and no distress Breasts: soft/nontender CV: RRR Pulm: nl effort, CTABL Abdomen: soft, non-tender, active bowel sounds Uterine Fundus: firm Lochia: appropriate DVT Evaluation: No evidence of DVT seen on physical exam. No cords or calf tenderness. No significant calf/ankle edema.  Recent Labs    03/12/18 1439  HGB 13.1  HCT 37.8  WBC 9.2  PLT 207    Assessment/Plan: 37 y.o. [id number] postpartum day # 0  -Continue routine PP care -Lactation consult PRN for breastfeeding support.  -Immunization status: all immunizations up to date  Disposition: Continue inpatient postpartum care.     LOS: 1 day   Genia DelMargaret Dagmawi Venable, CNM 03/13/2018, 8:47 AM   ----- Genia DelMargaret Yamilka Lopiccolo Certified Nurse Midwife [facility]

## 2018-03-13 NOTE — Progress Notes (Addendum)
Delivery Note  Date of delivery: 03/13/2018 Estimated Date of Delivery: 03/13/18 Patient's last menstrual period was 06/06/2017. EGA: 9168w0d  First Stage: Augmentation: misoprostol Analgesia /Anesthesia intrapartum: IV Stadol, epidural PROM at 0700 03/12/2018  Roe RutherfordAdriana Sweeney presented to L&D with PROM. She was induced with misoprostol and Pitocin.  IV Stadol given and epidural placed for pain relief.   Second Stage: Complete dilation at 0025 03/13/18 Onset of pushing at 0030 FHR second stage: category II for minimal variability, tachycardia, and variable decelerations Delivery at 0228 on 03/13/2018  She progressed to complete and after pushing for about an hour, I called Dr. Christeen DouglasBethany Beasley to the bedside to assess the patient. She found the baby to be asynclitic and posterior on exam and, after discussion with the patient, she was able to rotate the baby to an ROA position. At that point, she pushed well and had a spontaneous vaginal birth of a live female over an intact perineum. The fetal head was delivered in direct OA position with restitution to ROA. No nuchal cord. Anterior then posterior shoulders delivered with minimal assistance. Baby placed on mom's abdomen and attended to by transition RN. Cord clamped and cut after 60 second delay by father of the baby. Cord segment given to nurse for gases.   Third Stage: Placenta delivered spontaneously intact with 3VC at 0234 Placenta disposition: pathology for suspected intra-amniotic infection Uterine tone firm / bleeding scant IV pitocin given for hemorrhage prophylyaxis  No laceration identified  Anesthesia for repair: n/a Repair: n/a Est. Blood Loss (mL): 100  Complications: maternal fever and fetal tachycardia  Mom to postpartum.  Baby to Couplet care / Skin to Skin.  Newborn: Birth Weight: 7 lb 10.4 oz (3470 g)  Apgar Scores: 8, 9 Feeding planned: breastfeeding   Dr. Christeen DouglasBethany Beasley was present for the entirety of the  delivery.    Genia DelMargaret Steed Kanaan, [place] 03/13/2018 3:31 AM

## 2018-03-13 NOTE — Discharge Summary (Signed)
Obstetrical Discharge Summary  Patient Name: Yvonne Sweeney DOB: 1981-01-13 MRN: [id number]  Date of Admission: 03/12/2018 Date of Discharge: 03/14/2018  [facility]  Gestational Age at Delivery: [redacted]w[redacted]d   Antepartum complications:  1. AMA, age 37 2. Previous concern for cholestasis, but labs WNL   Admitting Diagnosis:  PROM Secondary Diagnosis: Cat II strip Patient Active Problem List   Diagnosis Date Noted  . PROM (premature rupture of membranes) 03/12/2018  . Supervision of normal pregnancy 04/03/2016    Augmentation: Pitocin Complications: None Intrapartum complications/course:  Yvonne Sweeney presented to L&D with PROM. She was induced with misoprostol and Pitocin.  IV Stadol given and epidural placed for pain relief. She progressed to complete and after pushing for about an hour, I called Dr. Christeen Douglas to the bedside to assess the patient due to category II fetal heart tracing with tachycardia, minimal variability, and recurrent variable decels. She found the baby to be asynclitic and posterior on exam and, after discussion with the patient, she was able to rotate the baby to an ROA position. At that point, she pushed well and had a spontaneous vaginal birth of a live female over an intact perineum. The fetal head was delivered in direct OA position with restitution to ROA. No nuchal cord. Anterior then posterior shoulders delivered with minimal assistance. Vigorous baby placed on mom's abdomen and attended to by transition RN. Cord clamped and cut after 60 second delay by father of the baby. Cord segment given to nurse for gases.   Date of Delivery: 03/13/18 Delivered By: Christeen Douglas, Genia Del Delivery Type: spontaneous vaginal delivery Anesthesia: epidural Placenta: Spontaneous Laceration: none Episiotomy: none Newborn Data: Live born female "[place]" Birth Weight: 7 lb 10.4 oz (3470 g) APGAR: 8, 9  Newborn Delivery   Birth date/time:   03/13/2018 02:28:00 Delivery type:  Vaginal, Spontaneous     Discharge Physical Exam:  BP 118/82 (BP Location: Left Arm)   Pulse 60   Temp 98 F (36.7 C) (Oral)   Resp 18   Ht 5\' 4"  (1.626 m)   Wt 93 kg (205 lb)   LMP 06/06/2017   SpO2 99%   Breastfeeding? Unknown   BMI 35.19 kg/m   General: NAD CV: RRR Pulm: CTABL, nl effort ABD: s/nd/nt, fundus firm and below the umbilicus Lochia: moderate DVT Evaluation: LE non-ttp, no evidence of DVT on exam.  Hemoglobin  Date Value Ref Range Status  03/14/2018 11.5 (L) 12.0 - 16.0 g/dL Final   HCT  Date Value Ref Range Status  03/14/2018 33.2 (L) 35.0 - 47.0 % Final    Postpartum Course  Patient had an uncomplicated postpartum course.  By time of discharge on PPD#1, her pain was controlled on oral pain medications; she had appropriate lochia and was ambulating, voiding without difficulty and tolerating regular diet.  She was deemed stable for discharge to home.     Postpartum Procedures: none  Disposition: stable, discharge to home. Baby Feeding: breastmilk & formula Baby Disposition: home with mom  Rh Immune globulin given: n/a Rubella vaccine given: n/a Tdap vaccine given in AP or PP setting: 01/08/18 Flu vaccine given in AP or PP setting: n/a  Contraception: TBD  Prenatal Labs: Blood type/Rh --/--/A POS (07/26 1439)  Antibody screen neg  Rubella Immune  Varicella Immune  RPR NR  HBsAg Neg  HIV NR  GC neg  Chlamydia neg  Genetic screening negative  1 hour GTT 90  3 hour GTT n/a  GBS negative  Plan:  Yvonne Sweeney was discharged to home in good condition. [facility] 6 weeks   Discharge Medications: Allergies as of 03/14/2018   No Known Allergies     Medication List    TAKE these medications   acetaminophen 325 MG tablet Commonly known as:  TYLENOL Take 2 tablets (650 mg total) by mouth every 4 (four) hours as needed for mild pain or moderate  pain.   ibuprofen 600 MG tablet Commonly known as:  ADVIL,MOTRIN Take 1 tablet (600 mg total) by mouth every 6 (six) hours as needed for mild pain, moderate pain or cramping.   prenatal multivitamin Tabs tablet Take 1 tablet by mouth daily at 12 noon.      Follow-up Information    Genia DelHaviland, Caiya Bettes, CNM. Schedule an appointment as soon as possible for a visit in 6 week(s).   Specialty:  Certified Nurse Midwife Why:  For routine postpartum visit. Contact information: [address] [place] [address] [phone]          Signed: Genia DelMargaret Jayana Kotula, CNM 03/14/2018 9:03 AM

## 2018-03-13 NOTE — Progress Notes (Signed)
Labor Progress Note  Yvonne Sweeney is a 37 y.o. G5P4004 at 3851w0d by LMP c/w 5161w2d ultrasound admitted for PROM.  Subjective:  Patient reports intense contractions, currently sitting for epidural placement.   Objective: BP 122/74   Pulse (!) 103   Temp 98.5 F (36.9 C) (Oral)   Resp 18   Ht 5\' 4"  (1.626 m)   Wt 93 kg (205 lb)   LMP 06/06/2017   BMI 35.19 kg/m   Fetal Assessment: FHT:  FHR: 150 bpm, variability: moderate,  accelerations:  Present,  decelerations:  Present variable, late, early Category/reactivity:  Category II UC:   irregular, every 2-3 minutes SVE:  Per RN Floy SabinaEvelyn Rivera at 2244 03/12/18 Dilation: 5.5cm  Effacement: 70-80%  Station:  -2  Position: middle  Membrane status: PROM 03/12/18 0700 Amniotic color: clear  Labs: Lab Results  Component Value Date   WBC 9.2 03/12/2018   HGB 13.1 03/12/2018   HCT 37.8 03/12/2018   MCV 91.6 03/12/2018   PLT 207 03/12/2018    Assessment / Plan: Induction of labor due to PROM  Labor: s/p 1 dose of misoprostol, Pitocin infusing at 975mu/min, contracting regularly Fetal Wellbeing:  Category II for non-recurrent variable and late decels, overall reassuring Pain Control:  Epidural Anticipated MOD:  NSVD  Genia DelMargaret Jacklynn Dehaas, CNM 03/13/2018, 12:08 AM

## 2018-03-13 NOTE — Anesthesia Procedure Notes (Signed)
Epidural Patient location during procedure: OB Start time: 03/12/2018 11:48 PM End time: 03/12/2018 11:56 PM  Staffing Anesthesiologist: Yves Dillarroll, Younes Degeorge, MD Performed: anesthesiologist   Preanesthetic Checklist Completed: patient identified, site marked, surgical consent, pre-op evaluation, timeout performed, IV checked, risks and benefits discussed and monitors and equipment checked  Epidural Patient position: sitting Prep: Betadine Patient monitoring: heart rate, continuous pulse ox and blood pressure Approach: midline Location: L4-L5 Injection technique: LOR air  Needle:  Needle type: Tuohy  Needle gauge: 17 G Needle length: 9 cm and 9 Catheter type: closed end flexible Catheter size: 19 Gauge Test dose: negative and 1.5% lidocaine with Epi 1:200 K  Assessment Events: blood not aspirated, injection not painful, no injection resistance, negative IV test and no paresthesia  Additional Notes Time out called.  Patient placed in sitting position.  Back prepped and draped in sterile fashion.  A skin wheal was made in the L4-L5 interspace with 1% Lidocaine plain.  A 17G Tuohy needle was advanced to the epidural space by a loss of resistance technique.  The epidural catheter was threaded 3 cm and the TD was negative.  No blood or paresthesias.  The patient tolerated the procedure well.Reason for block:procedure for pain

## 2018-03-13 NOTE — Plan of Care (Signed)
[facility] [phone]. Discussed plan of care with patient and spouse who verbalized understanding and agreed to plan of care. All questions answered.

## 2018-03-14 LAB — CBC
HEMATOCRIT: 33.2 % — AB (ref 35.0–47.0)
HEMOGLOBIN: 11.5 g/dL — AB (ref 12.0–16.0)
MCH: 31.9 pg (ref 26.0–34.0)
MCHC: 34.5 g/dL (ref 32.0–36.0)
MCV: 92.4 fL (ref 80.0–100.0)
Platelets: 176 10*3/uL (ref 150–440)
RBC: 3.6 MIL/uL — AB (ref 3.80–5.20)
RDW: 13.9 % (ref 11.5–14.5)
WBC: 11.3 10*3/uL — AB (ref 3.6–11.0)

## 2018-03-14 MED ORDER — ACETAMINOPHEN 325 MG PO TABS: 650.0000 mg | ORAL_TABLET | ORAL | 0 refills | Status: AC | PRN

## 2018-03-14 MED ORDER — IBUPROFEN 600 MG PO TABS: 600.0000 mg | ORAL_TABLET | Freq: Four times a day (QID) | ORAL | 0 refills | Status: AC | PRN

## 2018-03-14 NOTE — Progress Notes (Addendum)
Discharge instructions given. Patient verbalizes understanding of teaching. Interpreter used. Patient discharged home via wheelchair at 1505.

## 2018-03-14 NOTE — Anesthesia Postprocedure Evaluation (Signed)
Anesthesia Post Note  Patient: Yvonne Sweeney  Procedure(s) Performed: AN AD HOC LABOR EPIDURAL  Patient location during evaluation: Mother Baby Anesthesia Type: Epidural Level of consciousness: awake and alert Pain management: pain level controlled Vital Signs Assessment: post-procedure vital signs reviewed and stable Respiratory status: spontaneous breathing, nonlabored ventilation and respiratory function stable Cardiovascular status: stable Postop Assessment: no headache, no backache, adequate PO intake and able to ambulate Anesthetic complications: no     Last Vitals:  Vitals:   03/13/18 2355 03/14/18 0739  BP: 112/68 118/82  Pulse: 64 60  Resp: 18 18  Temp: 36.6 C 36.7 C  SpO2: 100% 99%    Last Pain:  Vitals:   03/14/18 0910  TempSrc:   PainSc: 0-No pain                 Lenard SimmerAndrew Faiga Stones

## 2018-03-16 LAB — SURGICAL PATHOLOGY

## 2020-07-23 ENCOUNTER — Emergency Department: Payer: HRSA Program

## 2020-07-23 ENCOUNTER — Other Ambulatory Visit: Payer: Self-pay

## 2020-07-23 DIAGNOSIS — Z79899 Other long term (current) drug therapy: Secondary | ICD-10-CM | POA: Insufficient documentation

## 2020-07-23 DIAGNOSIS — R0602 Shortness of breath: Secondary | ICD-10-CM | POA: Diagnosis present

## 2020-07-23 DIAGNOSIS — U071 COVID-19: Secondary | ICD-10-CM | POA: Diagnosis not present

## 2020-07-23 DIAGNOSIS — J1282 Pneumonia due to coronavirus disease 2019: Secondary | ICD-10-CM | POA: Diagnosis not present

## 2020-07-23 LAB — CBC
HCT: 37.4 % (ref 36.0–46.0)
Hemoglobin: 12.8 g/dL (ref 12.0–15.0)
MCH: 31 pg (ref 26.0–34.0)
MCHC: 34.2 g/dL (ref 30.0–36.0)
MCV: 90.6 fL (ref 80.0–100.0)
Platelets: 315 10*3/uL (ref 150–400)
RBC: 4.13 MIL/uL (ref 3.87–5.11)
RDW: 12.1 % (ref 11.5–15.5)
WBC: 7.5 10*3/uL (ref 4.0–10.5)
nRBC: 0 % (ref 0.0–0.2)

## 2020-07-23 LAB — TROPONIN I (HIGH SENSITIVITY)
Troponin I (High Sensitivity): 3 ng/L (ref <18)
Troponin I (High Sensitivity): <2 ng/L (ref <18)

## 2020-07-23 LAB — COMPREHENSIVE METABOLIC PANEL
ALT: 51 U/L — ABNORMAL HIGH (ref 0–44)
AST: 51 U/L — ABNORMAL HIGH (ref 15–41)
Albumin: 3.8 g/dL (ref 3.5–5.0)
Alkaline Phosphatase: 68 U/L (ref 38–126)
Anion gap: 9 (ref 5–15)
BUN: 11 mg/dL (ref 6–20)
CO2: 24 mmol/L (ref 22–32)
Calcium: 8.7 mg/dL — ABNORMAL LOW (ref 8.9–10.3)
Chloride: 107 mmol/L (ref 98–111)
Creatinine, Ser: 0.64 mg/dL (ref 0.44–1.00)
GFR, Estimated: >60 mL/min (ref >60)
Glucose, Bld: 111 mg/dL — ABNORMAL HIGH (ref 70–99)
Potassium: 3.7 mmol/L (ref 3.5–5.1)
Sodium: 140 mmol/L (ref 135–145)
Total Bilirubin: 1.3 mg/dL — ABNORMAL HIGH (ref 0.3–1.2)
Total Protein: 7.5 g/dL (ref 6.5–8.1)

## 2020-07-23 LAB — RESP PANEL BY RT-PCR (FLU A&B, COVID) ARPGX2
Influenza A by PCR: NEGATIVE
Influenza B by PCR: NEGATIVE
SARS Coronavirus 2 by RT PCR: POSITIVE — AB

## 2020-07-23 LAB — FIBRIN DERIVATIVES D-DIMER (ARMC ONLY): Fibrin derivatives D-dimer (ARMC): >7500 ng/mL (FEU) — ABNORMAL HIGH (ref 0.00–499.00)

## 2020-07-23 NOTE — ED Triage Notes (Signed)
Pt in with co shortness and cough was told by pmd to come here to rule out pneumonia. Pt has had symptoms for 5 days.

## 2020-07-24 ENCOUNTER — Emergency Department: Payer: HRSA Program

## 2020-07-24 ENCOUNTER — Emergency Department
Admission: EM | Admit: 2020-07-24 | Discharge: 2020-07-24 | Disposition: A | Discharge status: Home / Self Care | Payer: HRSA Program | Attending: Emergency Medicine | Admitting: Emergency Medicine

## 2020-07-24 DIAGNOSIS — J1282 Pneumonia due to coronavirus disease 2019: Secondary | ICD-10-CM

## 2020-07-24 MED ORDER — IOHEXOL 350 MG/ML SOLN
100.0000 mL | Freq: Once | INTRAVENOUS | Status: AC | PRN
  Administered 2020-07-24: 100 mL via INTRAVENOUS

## 2020-07-24 NOTE — ED Notes (Signed)
Pt back from ct, aox4, denies complaints, awaiting for ct results to complete ambulatory pulse ox trial

## 2020-07-24 NOTE — ED Notes (Signed)
Noted pt's d-dimer results; charge nurse notified and acuity level changed

## 2020-07-24 NOTE — ED Notes (Signed)
DC instructions reviewed by RN and provider

## 2020-07-24 NOTE — ED Notes (Signed)
Assumed care of pt from WR, denies cp however has felt sob for 5 days. Pt breathing regular and unlabored until exerting oneself. Awaiting CT scan at this time. Call bell within reach, talking in full sentences, AO x4

## 2020-07-24 NOTE — ED Provider Notes (Signed)
[facility] Emergency Department Provider Note  ____________________________________________  Time seen: Approximately 2:36 AM  I have reviewed the triage vital signs and the nursing notes.   HISTORY  Chief Complaint Shortness of Breath   HPI Yvonne Sweeney is a 39 y.o. female with no significant past medical history who presents for evaluation of shortness of breath. Patient reports being sick for 2 weeks with Covid-like symptoms. 5 days ago she started having shortness of breath which has been getting progressively worse. She went to urgent care today and was sent here for further evaluation.  Patient denies chest pain, nausea or vomiting, diarrhea, abdominal pain, fever. She does have a cough. She denies any personal or family history of blood clots, recent travel immobilization, leg pain or swelling, or exogenous hormones. Her symptoms are mostly present with exertion and resolve at rest.  Past Medical History:  Diagnosis Date  . Medical history non-contributory     Patient Active Problem List   Diagnosis Date Noted  . PROM (premature rupture of membranes) 03/12/2018  . Supervision of normal pregnancy 04/03/2016    Past Surgical History:  Procedure Laterality Date  . NO PAST SURGERIES      Prior to Admission medications   Medication Sig Start Date End Date Taking? Authorizing Provider  acetaminophen (TYLENOL) 325 MG tablet Take 2 tablets (650 mg total) by mouth every 4 (four) hours as needed for mild pain or moderate pain. 03/14/18   Genia Del, CNM  ibuprofen (ADVIL,MOTRIN) 600 MG tablet Take 1 tablet (600 mg total) by mouth every 6 (six) hours as needed for mild pain, moderate pain or cramping. 03/14/18   Genia Del, CNM  Prenatal Vit-Fe Fumarate-FA (PRENATAL MULTIVITAMIN) TABS tablet Take 1 tablet by mouth daily at 12 noon.    [provider]    Allergies Patient has no known allergies.  Family History  Problem  Relation Age of Onset  . Diabetes Maternal Grandmother   . Heart disease Paternal Grandmother   . Diabetes Maternal Aunt     Social History Social History   Tobacco Use  . Smoking status: Never Smoker  . Smokeless tobacco: Never Used  Substance Use Topics  . Alcohol use: No  . Drug use: No    Review of Systems  Constitutional: Negative for fever. Eyes: Negative for visual changes. ENT: Negative for sore throat. Neck: No neck pain  Cardiovascular: Negative for chest pain. Respiratory:+ shortness of breath, cough Gastrointestinal: Negative for abdominal pain, vomiting or diarrhea. Genitourinary: Negative for dysuria. Musculoskeletal: Negative for back pain. Skin: Negative for rash. Neurological: Negative for headaches, weakness or numbness. Psych: No SI or HI  ____________________________________________   PHYSICAL EXAM:  VITAL SIGNS: ED Triage Vitals  Enc Vitals Group     BP 07/23/20 1911 (!) 141/86     Pulse Rate 07/23/20 1911 88     Resp 07/23/20 1911 18     Temp 07/23/20 1911 98.7 F (37.1 C)     Temp Source 07/23/20 1911 Oral     SpO2 07/23/20 1911 97 %     Weight 07/23/20 1912 203 lb (92.1 kg)     Height --      Head Circumference --      Peak Flow --      Pain Score 07/23/20 1912 6     Pain Loc --      Pain Edu? --      Excl. in GC? --     Constitutional: Alert and  oriented. Well appearing and in no apparent distress. HEENT:      Head: Normocephalic and atraumatic.         Eyes: Conjunctivae are normal. Sclera is non-icteric.       Mouth/Throat: Mucous membranes are moist.       Neck: Supple with no signs of meningismus. Cardiovascular: Regular rate and rhythm. No murmurs, gallops, or rubs.  Respiratory: Normal respiratory effort. Lungs are clear to auscultation bilaterally.  Gastrointestinal: Soft, non tender, and non distended with positive bowel sounds.  Musculoskeletal: No edema, cyanosis, or erythema of extremities. Neurologic: Normal  speech and language. Face is symmetric. Moving all extremities. No gross focal neurologic deficits are appreciated. Skin: Skin is warm, dry and intact. No rash noted. Psychiatric: Mood and affect are normal. Speech and behavior are normal.  ____________________________________________   LABS (all labs ordered are listed, but only abnormal results are displayed)  Labs Reviewed  RESP PANEL BY RT-PCR (FLU A&B, COVID) ARPGX2 - Abnormal; Notable for the following components:      Result Value   SARS Coronavirus 2 by RT PCR POSITIVE (*)    All other components within normal limits  COMPREHENSIVE METABOLIC PANEL - Abnormal; Notable for the following components:   Glucose, Bld 111 (*)    Calcium 8.7 (*)    AST 51 (*)    ALT 51 (*)    Total Bilirubin 1.3 (*)    All other components within normal limits  FIBRIN DERIVATIVES D-DIMER ([facility] ONLY) - Abnormal; Notable for the following components:   Fibrin derivatives D-dimer ([facility]) >7,500.00 (*)    All other components within normal limits  CBC  TROPONIN I (HIGH SENSITIVITY)  TROPONIN I (HIGH SENSITIVITY)   ____________________________________________  EKG  ED ECG REPORT I, Nita Sickle, the attending physician, personally viewed and interpreted this ECG.  Normal sinus rhythm, rate of 81, normal intervals, normal axis, no ST elevations or depressions. No prior for comparison. ____________________________________________  RADIOLOGY  I have personally reviewed the images performed during this visit and I agree with the Radiologist's read.   Interpretation by Radiologist:  DG Chest 2 View  Result Date: 07/23/2020 CLINICAL DATA:  Shortness of breath, cough. EXAM: CHEST - 2 VIEW COMPARISON:  None. FINDINGS: The heart size and mediastinal contours are within normal limits. No pneumothorax or pleural effusion is noted. Multiple patchy airspace opacities are noted in both lungs consistent multifocal pneumonia. The visualized skeletal  structures are unremarkable. IMPRESSION: Bilateral multifocal pneumonia. Electronically Signed   By: Lupita Raider M.D.   On: 07/23/2020 19:35   CT Angio Chest PE W and/or Wo Contrast  Result Date: 07/24/2020 CLINICAL DATA:  Chest pain COVID positive EXAM: CT ANGIOGRAPHY CHEST WITH CONTRAST TECHNIQUE: Multidetector CT imaging of the chest was performed using the standard protocol during bolus administration of intravenous contrast. Multiplanar CT image reconstructions and MIPs were obtained to evaluate the vascular anatomy. CONTRAST:  OMNIPAQUE IOHEXOL 350 MG/ML SOLN COMPARISON:  None. FINDINGS: Cardiovascular: There is slightly suboptimal opacification of the main pulmonary artery, however no central or segmental pulmonary embolism. The heart is normal in size. No pericardial effusion or thickening. No evidence right heart strain. There is normal three-vessel brachiocephalic anatomy without proximal stenosis. The thoracic aorta is normal in appearance. Mediastinum/Nodes: No hilar, mediastinal, or axillary adenopathy. Thyroid gland, trachea, and esophagus demonstrate no significant findings. Lungs/Pleura: Multifocal patchy airspace opacities are seen throughout both lungs, predominantly at both lung bases. No pleural effusion or pneumothorax. Upper Abdomen: No  acute abnormalities present in the visualized portions of the upper abdomen. Musculoskeletal: No chest wall abnormality. No acute or significant osseous findings. Review of the MIP images confirms the above findings. IMPRESSION: Slightly suboptimal opacification of the main pulmonary artery however no central or proximal segmental pulmonary embolism. Extensive multifocal patchy airspace opacity seen predominantly within both lung bases, consistent with multifocal pneumonia. Electronically Signed   By: Jonna Clark M.D.   On: 07/24/2020 03:21     ____________________________________________   PROCEDURES  Procedure(s) performed:yes .1-3 Lead  EKG Interpretation Performed by: Nita Sickle, MD Authorized by: Nita Sickle, MD     Interpretation: normal     ECG rate assessment: normal     Rhythm: sinus rhythm     Ectopy: none     Critical Care performed:  None ____________________________________________   INITIAL IMPRESSION / ASSESSMENT AND PLAN / ED COURSE  39 y.o. female with no significant past medical history who presents for evaluation of shortness of breath with exertion x5 days in the setting of 2 weeks of Covid-like symptoms. Patient is Covid positive. Normal work of breathing and normal sats. Exam is otherwise unremarkable. EKG with no evidence of ischemia. Troponin is negative with no signs of myocarditis. D-dimer is elevated therefore we will send patient for CT angios to rule out PE. Flu negative. CBC showing no leukocytosis, no anemia, no thrombocytosis. Chemistry panel with no significant abnormalities. Chest x-ray visualized by me consistent with bilateral multifocal pneumonia, confirmed by radiology. Will ambulate patient to look for any signs of hypoxia. Patient is hypoxic or has a PE should be admitted to the hospital. Otherwise will be quarantine and outpatient follow-up with referral to monoclonal antibody clinic. Old medical records reviewed. Patient placed on telemetry for close monitoring.  _________________________ 3:44 AM on 07/24/2020 -----------------------------------------  CT negative for PE although extensive findings of Covid seen on bilateral lungs. Patient was ambulated and maintain her sats 93% and above with no significant increase work of breathing. Therefore she is stable for discharge home. Recommended close monitoring of oxygenation at home and return to the emergency room if sats are 90 or below. Otherwise follow-up with primary care doctor. Discussed quarantine with patient and household members. Will refer patient to monoclonal antibody therapy.     _____________________________________________ Please note:  Patient was evaluated in Emergency Department today for the symptoms described in the history of present illness. Patient was evaluated in the context of the global COVID-19 pandemic, which necessitated consideration that the patient might be at risk for infection with the SARS-CoV-2 virus that causes COVID-19. Institutional protocols and algorithms that pertain to the evaluation of patients at risk for COVID-19 are in a state of rapid change based on information released by regulatory bodies including the CDC and federal and state organizations. These policies and algorithms were followed during the patient's care in the ED.  Some ED evaluations and interventions may be delayed as a result of limited staffing during the pandemic.   [place] Controlled Substance Database was reviewed by me. ____________________________________________   FINAL CLINICAL IMPRESSION(S) / ED DIAGNOSES   Final diagnoses:  Pneumonia due to COVID-19 virus      NEW MEDICATIONS STARTED DURING THIS VISIT:  ED Discharge Orders    None       Note:  This document was prepared using Dragon voice recognition software and may include unintentional dictation errors.    Don Perking, [place], MD 07/24/20 [phone]

## 2020-07-24 NOTE — ED Notes (Signed)
Pt passed ambulatory trial, MD made aware

## 2020-07-24 NOTE — Discharge Instructions (Addendum)
Si su COVID es positivo, asegrese de Proofreader un oxmetro de Aeronautical engineer. Asegrese de medir su nivel de oxgeno al [employer] al da, en reposo y mientras camina hacia el bao. Si su nivel de oxgeno es inferior al 90% o si tiene Journalist, newspaper, regrese a la sala de Sports administrator. De lo contrario, consulte con su mdico en 3 das. Tome Tylenol o ibuprofeno para la [place], los dolores de [place] y los dolores corporales. Debe permanecer en cuarentena hasta que est libre de sntomas durante al menos 7 [place].  Para ayudar a estimular su sistema inmunolgico contra COVID-19, tome:  - Vitamin D3 4,000 IU/dia - Vitamin C 500-1,000?mg dos veces al da - Quercetin 250?mg dos veces al da - Zinc 100?mg/dia - Melatonin 10?mg antes de acostarse (causa somnolencia) - Aspirin 325?mg/dia  Todos estos medicamentos son de Secundino Ginger y no necesitan receta mdica.

## 2021-01-30 ENCOUNTER — Ambulatory Visit: Payer: Self-pay | Attending: Oncology

## 2021-01-30 ENCOUNTER — Other Ambulatory Visit: Payer: Self-pay

## 2021-01-30 ENCOUNTER — Ambulatory Visit
Admission: RE | Admit: 2021-01-30 | Discharge: 2021-01-30 | Disposition: A | Discharge status: Home / Self Care | Payer: Self-pay | Source: Ambulatory Visit | Attending: Oncology | Admitting: Oncology

## 2021-01-30 VITALS — BP 127/81 | HR 76 | Temp 97.1°F | Ht 65.0 in | Wt 205.0 lb

## 2021-01-30 DIAGNOSIS — N644 Mastodynia: Secondary | ICD-10-CM

## 2021-01-30 DIAGNOSIS — N63 Unspecified lump in unspecified breast: Secondary | ICD-10-CM

## 2021-01-30 NOTE — Progress Notes (Signed)
  Subjective:     Patient ID: Yvonne Sweeney, female   DOB: December 09, 1980, 40 y.o.   MRN: [id number]  HPI   Review of Systems     Objective:   Physical Exam Chest:  Breasts:    Breasts are asymmetrical.     Right: Tenderness present. No swelling, bleeding, inverted nipple, mass, nipple discharge or skin change.     Left: No swelling, bleeding, inverted nipple, mass, nipple discharge, skin change or tenderness.     Comments: Left breast larger than right      Assessment:     40 year old Hispanic patient referred for left breast pain, and milky discharge on expression x 3 months. Delos Haring interpreted exam. Patient screened, and meets BCCCP eligibility.  Patient does not have insurance, Medicare or Medicaid.  Instructed patient on breast self awareness using teach back method.  Clinical breast exam unremarkable.  No pain reported on palpation.  Points to outer breast 3 o'clock as area of reported pain .   States she has milky discharge on expression only.  Reason for expression is burning sensation.  Risk Assessment     Risk Scores       01/30/2021   Last edited by: Scarlett Presto, RN   5-year risk: 0.3 %   Lifetime risk: 5.6 %               Plan:     Sent for baseline bilateral diagnostic mammogram, and ultrasound.

## 2021-01-30 NOTE — Progress Notes (Signed)
Radiologist reviewed Birads 3 findings with patient.  Scheduled to return 08/02/21 for 6 month follow-up mammogram for stability.  Mailed appointment reminder.

## 2021-01-30 NOTE — Progress Notes (Signed)
Ms  

## 2021-08-02 ENCOUNTER — Other Ambulatory Visit: Payer: Self-pay

## 2021-08-02 ENCOUNTER — Ambulatory Visit
Admission: RE | Admit: 2021-08-02 | Discharge: 2021-08-02 | Disposition: A | Discharge status: Home / Self Care | Payer: Self-pay | Source: Ambulatory Visit | Attending: Oncology | Admitting: Oncology

## 2021-08-02 DIAGNOSIS — N63 Unspecified lump in unspecified breast: Secondary | ICD-10-CM | POA: Insufficient documentation

## 2021-08-20 ENCOUNTER — Encounter: Payer: Self-pay | Admitting: *Deleted

## 2021-08-20 NOTE — Progress Notes (Signed)
Reviewed 6 month follow up mammogram of a birads 3.  Patient will be due for bilateral diagnostic mammogram and ultrasound in June of 2023.  Patient will also be due to renew her BCCCP eligibility at that time.  I will transition patient's care to Fonnie Mu, RN with BCCCP.  I will cc Wynona Canes and Joellyn Quails these notes.

## 2021-08-23 ENCOUNTER — Other Ambulatory Visit: Payer: Self-pay | Admitting: *Deleted

## 2021-08-23 DIAGNOSIS — N6320 Unspecified lump in the left breast, unspecified quadrant: Secondary | ICD-10-CM

## 2022-01-21 ENCOUNTER — Ambulatory Visit: Payer: Self-pay | Attending: Obstetrics and Gynecology | Admitting: *Deleted

## 2022-01-21 ENCOUNTER — Encounter: Payer: Self-pay | Admitting: *Deleted

## 2022-01-21 VITALS — BP 125/75 | Wt 207.4 lb

## 2022-01-21 DIAGNOSIS — N6320 Unspecified lump in the left breast, unspecified quadrant: Secondary | ICD-10-CM

## 2022-01-21 NOTE — Progress Notes (Signed)
Yvonne Sweeney is a 41 y.o. female who presents to [facility] clinic today with recent mammogram on 08/02/21 that was a birads 3. Presents today for 6 month follow up and annual mammogram..    Pap Smear: Pap not smear completed today. Last Pap smear was 06/19/20 at the [facility] clinic and was normal.No HPV co-testing was completed. Per patient has no history of an abnormal Pap smear. Last Pap smear result is available in Epic.   Physical exam: Breasts Breasts symmetrical. No skin abnormalities bilateral breasts. No nipple retraction bilateral breasts. No nipple discharge bilateral breasts. No lymphadenopathy. No lumps palpated bilateral breasts.       Pelvic/Bimanual Pap is not indicated today    Smoking History: Patient has never smoked    Patient Navigation: Patient education provided. Access to services provided for patient through [facility] program. Jonetta Speak [phone] AMN interpreter provided. No transportation provided   Colorectal Cancer Screening: Patient is not age appropriate for colon screening.  No complaints today.    Breast and Cervical Cancer Risk Assessment: Patient does not have family history of breast cancer, known genetic mutations, or radiation treatment to the chest before age 84. Patient does not have history of cervical dysplasia, immunocompromised, or DES exposure in-utero.  Risk Assessment     Risk Scores       01/21/2022 01/30/2021   Last edited by: Narda Rutherford, LPN Scarlett Presto, RN   5-year risk: 0.4 % 0.3 %   Lifetime risk: 5.6 % 5.6 %            A: BCCCP exam without pap smear Complaint of recent abnormal mammogram  P: Referred patient to the [facility] for a diagnostic mammogram. Appointment scheduled for 02/03/22.  Patient given a copy of her appointment date and time.  Jim Like, RN 01/21/2022 2:16 PM

## 2022-02-03 ENCOUNTER — Ambulatory Visit
Admission: RE | Admit: 2022-02-03 | Discharge: 2022-02-03 | Disposition: A | Discharge status: Home / Self Care | Payer: Self-pay | Source: Ambulatory Visit | Attending: Obstetrics and Gynecology | Admitting: Obstetrics and Gynecology

## 2022-02-03 DIAGNOSIS — N6320 Unspecified lump in the left breast, unspecified quadrant: Secondary | ICD-10-CM

## 2023-01-13 ENCOUNTER — Other Ambulatory Visit: Payer: Self-pay

## 2023-01-13 DIAGNOSIS — N6320 Unspecified lump in the left breast, unspecified quadrant: Secondary | ICD-10-CM

## 2023-01-14 ENCOUNTER — Encounter: Payer: Self-pay | Admitting: Primary Care

## 2023-02-09 ENCOUNTER — Ambulatory Visit
Admission: RE | Admit: 2023-02-09 | Discharge: 2023-02-09 | Disposition: A | Discharge status: Home / Self Care | Payer: Self-pay | Source: Ambulatory Visit | Attending: Obstetrics and Gynecology | Admitting: Obstetrics and Gynecology

## 2023-02-09 ENCOUNTER — Ambulatory Visit: Payer: Self-pay | Attending: Hematology and Oncology | Admitting: Hematology and Oncology

## 2023-02-09 VITALS — BP 117/78 | Wt 212.8 lb

## 2023-02-09 DIAGNOSIS — N6320 Unspecified lump in the left breast, unspecified quadrant: Secondary | ICD-10-CM | POA: Insufficient documentation

## 2023-02-09 NOTE — Patient Instructions (Signed)
Taught Yvonne Sweeney about self breast awareness and gave educational materials to take home. Patient did not need a Pap smear today due to last Pap smear was in 06/19/2020 per patient.  Let her know BCCCP will cover Pap smears every 5 years unless has a history of abnormal Pap smears. Referred patient to the [facility] for diagnostic mammogram. Appointment scheduled for 02/09/2023. Patient aware of appointment and will be there. Let patient know will follow up with her within the next couple weeks with results. Emi Lymon verbalized understanding.  Pascal Lux, NP 9:21 AM

## 2023-02-09 NOTE — Progress Notes (Signed)
[name] is a 42 y.o. female who presents to [facility] clinic today with no complaints.    Pap Smear: Pap not smear completed today. Last Pap smear was 06/19/20 at [facility] clinic and was normal. Per patient has no history of an abnormal Pap smear. Last Pap smear result is available in Epic. Will need pap smear repeated in November 2024.   Physical exam: Breasts Breasts symmetrical. No skin abnormalities bilateral breasts. No nipple retraction bilateral breasts. No nipple discharge bilateral breasts. No lymphadenopathy. No lumps palpated bilateral breasts. MS DIGITAL DIAG TOMO BILAT  Result Date: 02/03/2022 CLINICAL DATA:  Patient presents for bilateral diagnostic examination to follow-up a probable benign left breast mass. EXAM: DIGITAL DIAGNOSTIC BILATERAL MAMMOGRAM WITH TOMOSYNTHESIS AND CAD TECHNIQUE: Bilateral digital diagnostic mammography and breast tomosynthesis was performed. The images were evaluated with computer-aided detection. COMPARISON:  Previous exam(s). ACR Breast Density Category b: There are scattered areas of fibroglandular density. FINDINGS: Examination demonstrates a stable oval subcentimeter circumscribed mass over the middle third of the slightly medial midportion of the left breast. Remainder of the left breast as well as the right breast is unchanged. IMPRESSION: Stable probable benign left breast mass. RECOMMENDATION: Recommend 1 additional follow-up diagnostic left breast mammogram and possible ultrasound in 1 year at the time patient's annual bilateral mammogram in June 2024 to document 2 years of stability. I have discussed the findings and recommendations with the patient. If applicable, a reminder letter will be sent to the patient regarding the next appointment. BI-RADS CATEGORY  3: Probably benign. Electronically Signed   By: Elberta Fortis M.D.   On: 02/03/2022 10:17  MS DIGITAL DIAG TOMO UNI LEFT  Result Date: 08/02/2021 CLINICAL DATA:  Six-month follow-up  for a likely benign left breast mass. EXAM: DIGITAL DIAGNOSTIC UNILATERAL LEFT MAMMOGRAM WITH TOMOSYNTHESIS AND CAD; ULTRASOUND LEFT BREAST LIMITED TECHNIQUE: Left digital diagnostic mammography and breast tomosynthesis was performed. The images were evaluated with computer-aided detection.; Targeted ultrasound examination of the left breast was performed. COMPARISON:  Previous exam(s). ACR Breast Density Category b: There are scattered areas of fibroglandular density. FINDINGS: The oval mass in the central left breast is mammographically stable. No new suspicious calcifications, masses or areas of distortion are seen in the left breast. Ultrasound of the left breast at 12 o'clock, 3 cm from the nipple demonstrates a stable oval hypoechoic mass measuring 7 x 4 x 6 mm, previously 6 x 3 x 6 mm. IMPRESSION: 1.  The likely benign left breast mass at 12 o'clock is stable. RECOMMENDATION: Six-month follow-up bilateral diagnostic mammogram and left breast ultrasound. I have discussed the findings and recommendations with the patient. If applicable, a reminder letter will be sent to the patient regarding the next appointment. BI-RADS CATEGORY  3: Probably benign. Electronically Signed   By: Frederico Hamman M.D.   On: 08/02/2021 10:37  MS DIGITAL DIAG TOMO BILAT  Result Date: 01/30/2021 CLINICAL DATA:  LEFT breast pain and burning, 3-6 months. Non spontaneous LEFT nipple discharge, milk like in appearance. EXAM: DIGITAL DIAGNOSTIC BILATERAL MAMMOGRAM WITH TOMOSYNTHESIS AND CAD; ULTRASOUND LEFT BREAST LIMITED TECHNIQUE: Bilateral digital diagnostic mammography and breast tomosynthesis was performed. The images were evaluated with computer-aided detection.; Targeted ultrasound examination of the left breast was performed COMPARISON:  None.  Baseline ACR Breast Density Category b: There are scattered areas of fibroglandular density. FINDINGS: Spot compression tomosynthesis views were obtained of the site of painful  concern in the LEFT breast. There are no suspicious subjacent mammographic findings. Spot  compression tomosynthesis views were obtained of the retroareolar breast. No suspicious mammographic etiology for nipple discharge is identified. There is note of an oval circumscribed mass in the LEFT breast at middle depth. No suspicious mass, distortion, or microcalcifications are identified to suggest presence of malignancy in the RIGHT breast. On physical exam, no suspicious mass is appreciated. Targeted ultrasound was performed of the site of painful concern in the LEFT upper outer breast. No suspicious cystic or solid mass is seen. No sonographic etiology for LEFT breast pain identified. Targeted LEFT breast retroareolar ultrasound was performed. No suspicious cystic or solid mass is seen. No suspicious intraductal mass is seen. Targeted central breast ultrasound was performed. At 12 o'clock 3 cm from the nipple, there is an oval circumscribed hypoechoic mass. It measures 6 x 3 x 6 mm. This corresponds to the site of mammographic concern. IMPRESSION: 1. There is a probably benign 6 mm LEFT breast mass at 12 o'clock 3 cm from the nipple. Recommend follow-up mammogram and ultrasound in 6 months. This will establish 6 months of definitive stability. 2. No mammographic or sonographic evidence of malignancy at the site of painful concern in the LEFT breast. No mammographic or sonographic etiology for non spontaneous likely physiologic LEFT nipple discharge identified. Any further workup of the patient's symptoms should be based on the clinical assessment. 3. No mammographic evidence of malignancy in the RIGHT breast. RECOMMENDATION: LEFT diagnostic mammogram and ultrasound in 6 months. I have discussed the findings and recommendations with the patient. If applicable, a reminder letter will be sent to the patient regarding the next appointment. BI-RADS CATEGORY  3: Probably benign. Electronically Signed   By: Meda Klinefelter MD   On: 01/30/2021 10:29         Pelvic/Bimanual Pap is not indicated today    Smoking History: Patient has never smoked and was not referred to quit line.    Patient Navigation: Patient education provided. Access to services provided for patient through [facility] program. Delos Haring interpreter provided. No transportation provided   Colorectal Cancer Screening: Per patient has never had colonoscopy completed No complaints today.    Breast and Cervical Cancer Risk Assessment: Patient does not have family history of breast cancer, known genetic mutations, or radiation treatment to the chest before age 47. Patient does not have history of cervical dysplasia, immunocompromised, or DES exposure in-utero.  Risk Assessment   No risk assessment data for the current encounter  Risk Scores       01/21/2022   Last edited by: Narda Rutherford, LPN   5-year risk: 0.4 %   Lifetime risk: 5.6 %            A: [facility] exam without pap smear No complaints with benign exam. Follow up of probable left breast mass.   P: Referred patient to the [facility] for a diagnostic mammogram. Appointment scheduled 02/09/23.  Ilda Basset A, NP 02/09/2023 9:20 AM

## 2023-03-30 ENCOUNTER — Ambulatory Visit: Payer: Self-pay

## 2024-04-01 IMAGING — MG DIGITAL DIAGNOSTIC BILAT W/ TOMO W/ CAD
8 series · 8 of 24 positions shown · non-contrast
Comparison: Previous exam(s).

CLINICAL DATA: Patient presents for bilateral diagnostic
examination to follow-up a probable benign left breast mass.

EXAM:
DIGITAL DIAGNOSTIC BILATERAL MAMMOGRAM WITH TOMOSYNTHESIS AND CAD
TECHNIQUE: Bilateral digital diagnostic mammography and breast tomosynthesis
was performed. The images were evaluated with computer-aided
detection.

[L MLO synth-2D]
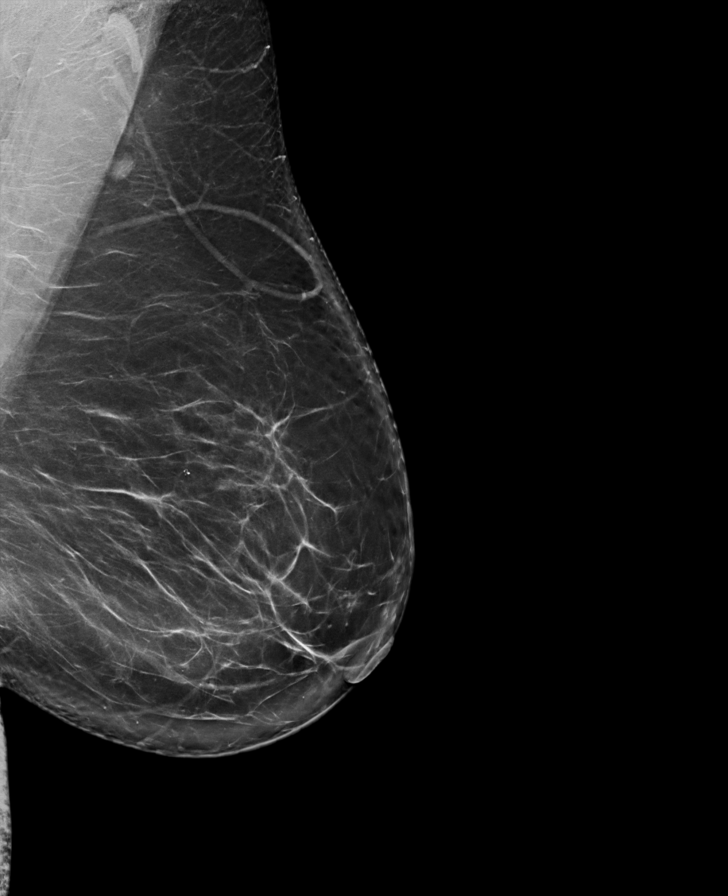

[L CC synth-2D]
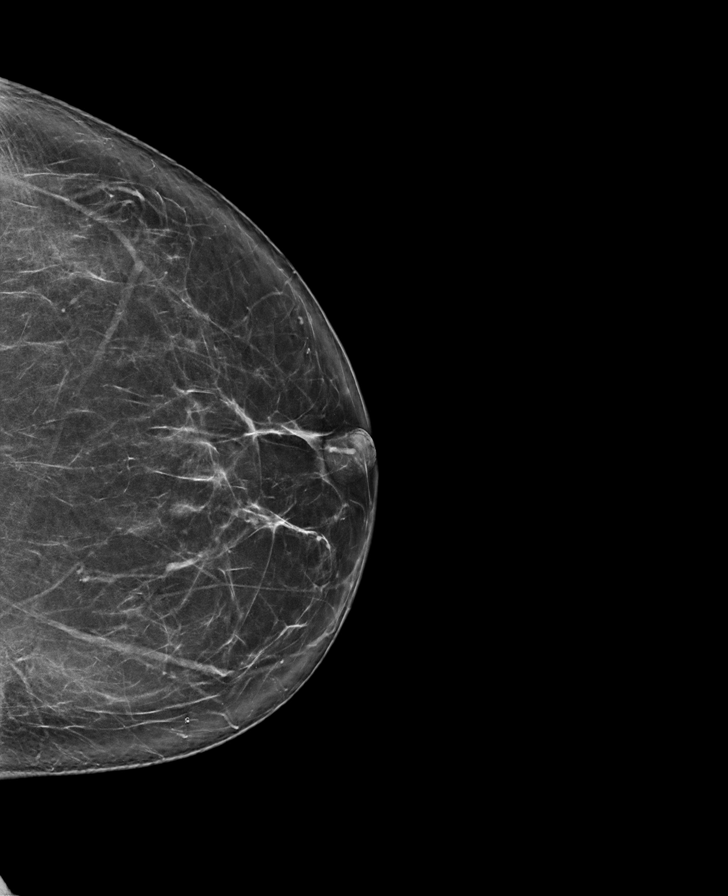

[R CC synth-2D]
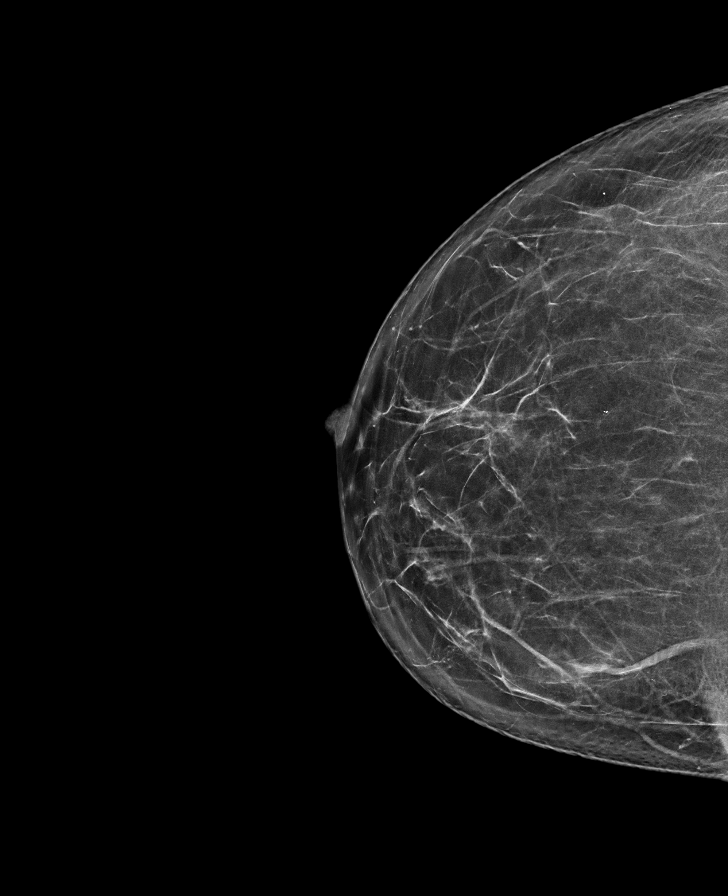

[R MLO synth-2D]
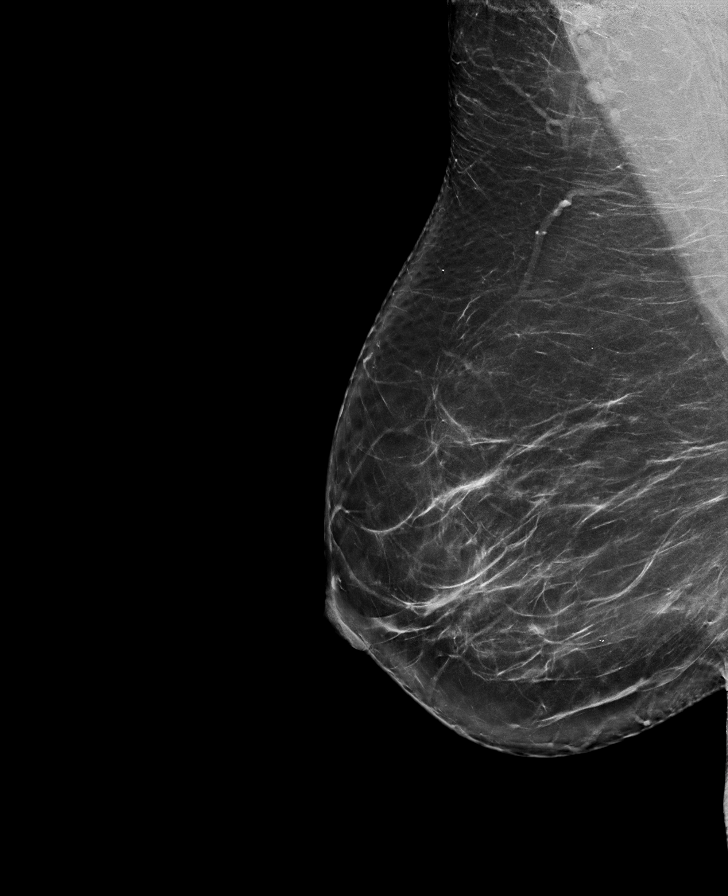

[R MLO tomo · tomo slice 47/92.0]
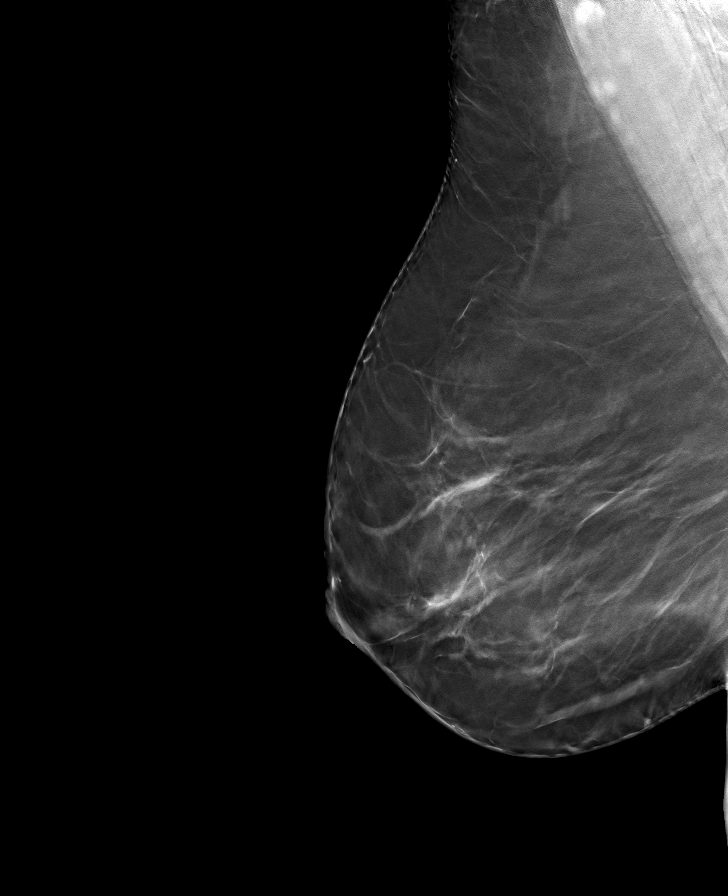

[L MLO tomo · tomo slice 49/98.0]
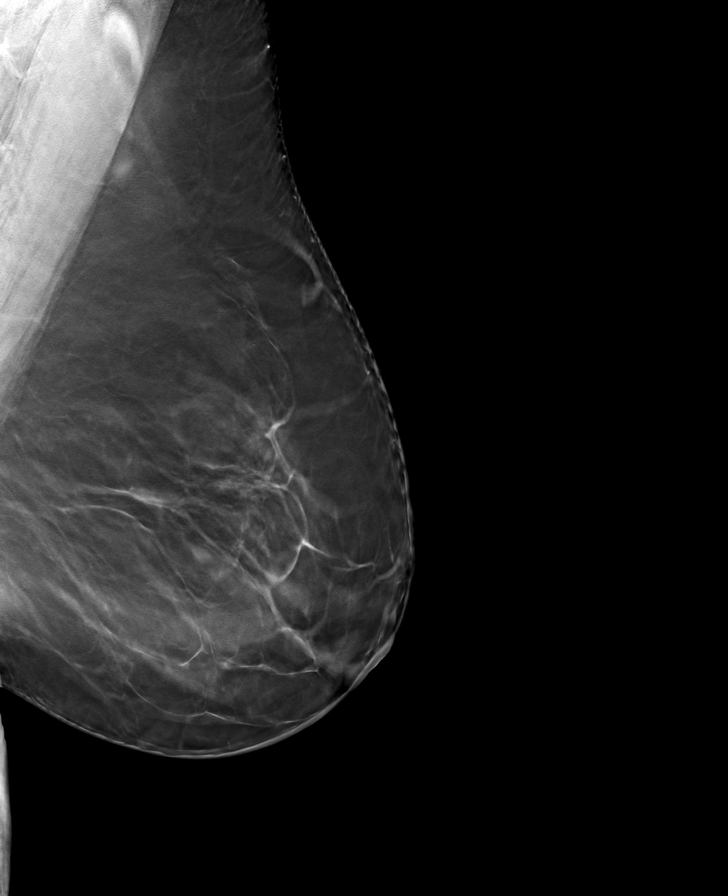

[R CC tomo · tomo slice 37/74.0]
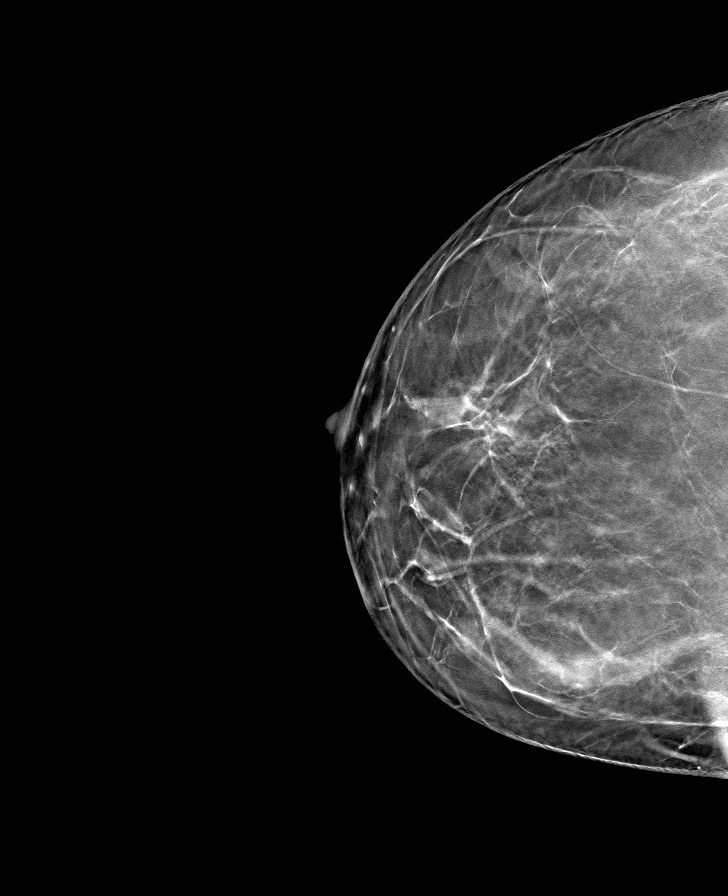

[L CC tomo · tomo slice 41/81.0]
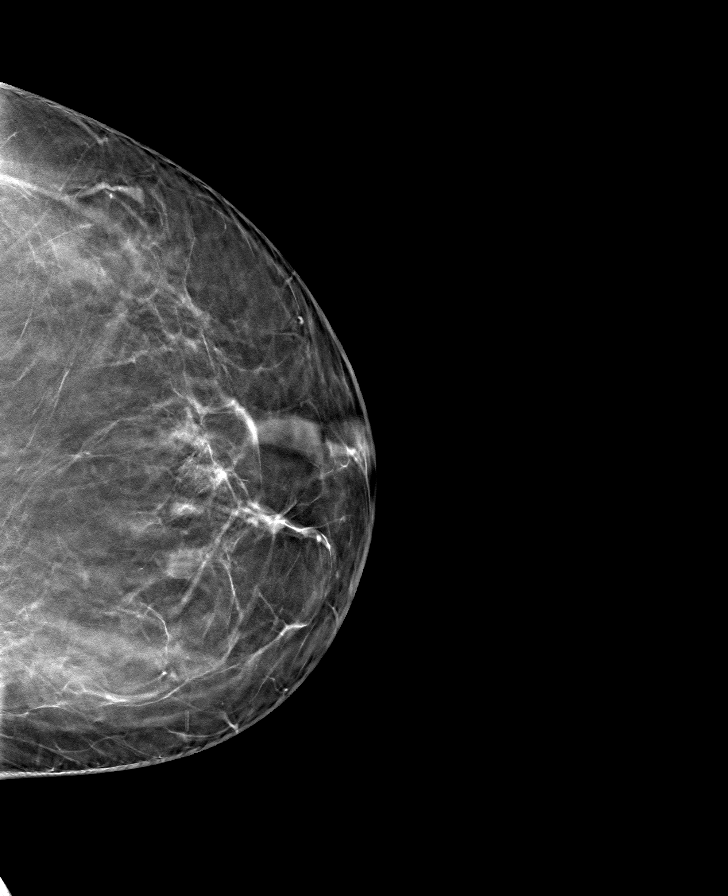

[8 of 24 positions shown; findings below may reference images not displayed]

ACR Breast Density Category b: There are scattered areas of
fibroglandular density.
FINDINGS: Examination demonstrates a stable oval subcentimeter circumscribed
mass over the middle third of the slightly medial midportion of the
left breast. Remainder of the left breast as well as the right
breast is unchanged.
IMPRESSION: Stable probable benign left breast mass.

RECOMMENDATION:
Recommend 1 additional follow-up diagnostic left breast mammogram
and possible ultrasound in 1 year at the time patient's annual
bilateral mammogram in January 2023 to document 2 years of stability.

I have discussed the findings and recommendations with the patient.
If applicable, a reminder letter will be sent to the patient
regarding the next appointment.

BI-RADS CATEGORY  3: Probably benign.
